# Patient Record
Sex: Female | Born: 1960 | Race: White | Hispanic: No | Smoking: Former smoker
Health system: Southern US, Community
[De-identification: ages and names within clinical notes are randomized; demographics above are authoritative.]

## PROBLEM LIST (undated history)

## (undated) DIAGNOSIS — F319 Bipolar disorder, unspecified: Secondary | ICD-10-CM

## (undated) DIAGNOSIS — L409 Psoriasis, unspecified: Secondary | ICD-10-CM

## (undated) DIAGNOSIS — T7840XA Allergy, unspecified, initial encounter: Secondary | ICD-10-CM

## (undated) DIAGNOSIS — F419 Anxiety disorder, unspecified: Secondary | ICD-10-CM

## (undated) DIAGNOSIS — E782 Mixed hyperlipidemia: Secondary | ICD-10-CM

## (undated) DIAGNOSIS — E119 Type 2 diabetes mellitus without complications: Secondary | ICD-10-CM

## (undated) DIAGNOSIS — C50919 Malignant neoplasm of unspecified site of unspecified female breast: Secondary | ICD-10-CM

## (undated) DIAGNOSIS — Z8679 Personal history of other diseases of the circulatory system: Secondary | ICD-10-CM

## (undated) DIAGNOSIS — I10 Essential (primary) hypertension: Secondary | ICD-10-CM

## (undated) DIAGNOSIS — Z9889 Other specified postprocedural states: Secondary | ICD-10-CM

## (undated) DIAGNOSIS — Z8669 Personal history of other diseases of the nervous system and sense organs: Secondary | ICD-10-CM

## (undated) DIAGNOSIS — F329 Major depressive disorder, single episode, unspecified: Secondary | ICD-10-CM

## (undated) DIAGNOSIS — K219 Gastro-esophageal reflux disease without esophagitis: Secondary | ICD-10-CM

## (undated) DIAGNOSIS — F32A Depression, unspecified: Secondary | ICD-10-CM

## (undated) HISTORY — DX: Bipolar disorder, unspecified: F31.9

## (undated) HISTORY — DX: Malignant neoplasm of unspecified site of unspecified female breast: C50.919

## (undated) HISTORY — PX: BREAST SURGERY: SHX581

## (undated) HISTORY — DX: Mixed hyperlipidemia: E78.2

## (undated) HISTORY — DX: Essential (primary) hypertension: I10

## (undated) HISTORY — DX: Type 2 diabetes mellitus without complications: E11.9

---

## 1990-09-23 HISTORY — PX: LAPAROSCOPY: SHX197

## 2010-09-23 DIAGNOSIS — Z9889 Other specified postprocedural states: Secondary | ICD-10-CM

## 2010-09-23 HISTORY — DX: Other specified postprocedural states: Z98.890

## 2013-05-18 ENCOUNTER — Telehealth: Payer: Self-pay | Admitting: *Deleted

## 2013-05-18 NOTE — Telephone Encounter (Signed)
Confirmed 06/01/13 appt w/ pt.  Mailed before appt letter & packet to pt.  Took paperwork to Med Rec for chart.

## 2013-06-01 ENCOUNTER — Encounter: Payer: Self-pay | Admitting: Oncology

## 2013-06-01 ENCOUNTER — Ambulatory Visit (HOSPITAL_BASED_OUTPATIENT_CLINIC_OR_DEPARTMENT_OTHER): Payer: Medicare Other | Admitting: Oncology

## 2013-06-01 ENCOUNTER — Other Ambulatory Visit: Payer: Medicare Other | Admitting: Lab

## 2013-06-01 ENCOUNTER — Ambulatory Visit: Payer: Medicare Other

## 2013-06-01 VITALS — BP 141/81 | HR 77 | Temp 98.3°F | Resp 18 | Ht 65.0 in | Wt 198.4 lb

## 2013-06-01 DIAGNOSIS — Z170 Estrogen receptor positive status [ER+]: Secondary | ICD-10-CM

## 2013-06-01 DIAGNOSIS — C50419 Malignant neoplasm of upper-outer quadrant of unspecified female breast: Secondary | ICD-10-CM

## 2013-06-01 DIAGNOSIS — C50412 Malignant neoplasm of upper-outer quadrant of left female breast: Secondary | ICD-10-CM

## 2013-06-01 NOTE — Progress Notes (Signed)
Patient ID: Claire Mcbride, female   DOB: 1961-08-19, 52 y.o.   MRN: [id number] ID: Claire Mcbride OB: 1961/03/15  MR#: [id number]  NWG#:[id number]  PCP: No PCP Per Patient GYN:   SU:  OTHER MD: Santiago Bumpers. Fintel, Caren Hollice Gong NP, Maryella Shivers Williams   HISTORY OF PRESENT ILLNESS: Aarianna had routine screening mammography 06/09/2012 at [facility] in [place] showing a nodular density in the left breast associated with intermediate calcifications. Additional views 07/08/2012 confirmed a persistent spiculated density associated with pleomorphic calcifications. On 08/12/2012 the patient underwent ultrasound-guided biopsy of the left breast mass which measured approximately 8 mm at the 1145 position of the left breast as well as a prominent level I lymph node which showed diffuse cortical thickening. 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  On 08/26/2012 the patient underwent bilateral breast MRI confirming an irregular enhancing lesion in the upper inner left breast measuring 3.6 cm in maximal dimensions. The biopsied level I node previously noted measured 2.4 cm. There were other adjacent irregular nodes which were also suspicious no other significant findings were noted. The patient was treated neoadjuvantly with tamoxifen started January 2014.  On 03/17/2013 the patient proceeded to right simple mastectomy and axillary lymph node sampling and left modified radical mastectomy under Dr. Mayford Knife. The final pathology from this procedure ([id number]) showed, in the right breast, no evidence of malignancy and 5 lymph nodes clear.  In the left breast, there was a 2.3 cm invasive ductal carcinoma, grade 3,  involving 14 of 16 axillary lymph nodes sampled. 2 of these were micrometastatic metastases. There was no definite extracapsular extension. The pathologic stage accordingly was pT2 pN3 or stage IIIC.   The patient met with Dr. Waldemar Dickens 03/22/2013 to discuss systemic therapy, and his suggestion was that she continue on tamoxifen. The patient is here today for a second opinion.  INTERVAL HISTORY: Jonny Ruiz was seen in the breast clinic 06/01/2013 and accompanied by her mother.  REVIEW OF SYSTEMS: The patient has a diffusely positive review of systems complaints at of problems with sleep, fatigue, left arm pain (but not swelling) and muscle aches, which are intermittent. She has blurred vision at times. She has some runny nose and other seasonal sinus symptoms. Sometimes she has pain in her chest from her surgery, but not angina or palpitations. She can be short of breath at times, has a dry cough sometimes, has heartburn, has some urinary incontinence which is intermittent, has a history of psoriasis, has some occasional headaches, which are mild, and has a history of anxiety, depression, phobia as and hallucinations. She has problems with diabetes and hot flashes. Otherwise a detailed review of systems today was negative.   PAST MEDICAL HISTORY: No past medical history on file. The patient tells me she has undergone abdominal or laparoscopy to remove some scar tissue in has a right ovarian chronic pain possibly associated with that. She had an overdose of medication 2006. She had a left breast cyst removed in 2003. She tells me that she has bipolar disorder and schizoaffective disorder. She is followed [facility] the [facility] by Elayne Guerin, a nurse practitioner in [place] [phone]).  PAST SURGICAL HISTORY: No past surgical history on file. Status post bilateral  mastectomies with left axillary lymph node dissection  FAMILY HISTORY No family history on  file. The patient's parents are living, in their mid 101s. The patient has one brother and one sister. The patient's mother was diagnosed with in situ breast cancer at the age of 48. There are some cousins with a diagnosis of breast and other cancers, but no other first degree relative with cancer and no history of ovarian cancer in the family. The patient was tested for the BRCA gene and was found to be negative  GYNECOLOGIC HISTORY:   menarche age 41, first live birth age 52, the patient is GX P1. She stopped having periods in 2008.  SOCIAL HISTORY:  The patient is disabled secondary to her psychiatric history. She is divorced. Her mother is with her during this visit. The patient's daughter Alphonzo Dublin lives in [place] of [place] where she is a housewife. The patient has 2 grandchildren. She attends a [employer], research (medical) of [employer].    ADVANCED DIRECTIVES: The patient stated that she completed advanced directives at the time of her mastectomy. With further questioning, what she explained today is that she does not desire futile care or death prolonging care. She would agree to life support if it was instituted with the expectation that she would survive at with an adequate quality of life. The patient's mother was present during this discussion, which took place 06/01/2013   HEALTH MAINTENANCE: History  Substance Use Topics  . Smoking status: Not on file  . Smokeless tobacco: Not on file  . Alcohol Use: Not on file   she smokes approximately 1/2 packs of cigarettes daily, but is planning to switch to an electronic non-nicotine cigarettes. The patient was strongly advised to quit smoking altogether and was offered participation in local smoking cessation programs.   Colonoscopy:-  PAP: 2012  Bone density:  Lipid panel:  Allergies  Allergen Reactions  . Bee Venom   . Diclofenac   . Lidocaine   . Lisinopril   . Losartan   . Other     IV electrolyte solution  . Penicillins   . Protonix  [Pantoprazole Sodium]   . Relpax [Eletriptan]   . Zantac [Ranitidine Hcl]     Current Outpatient Prescriptions  Medication Sig Dispense Refill  . atenolol (TENORMIN) 50 MG tablet Take 50 mg by mouth daily.      Marland Kitchen atorvastatin (LIPITOR) 10 MG tablet Take 10 mg by mouth daily.      Marland Kitchen buPROPion (WELLBUTRIN XL) 300 MG 24 hr tablet Take 300 mg by mouth daily.      . Cholecalciferol (VITAMIN D-3 PO) Take 50,000 Int'l Units by mouth every 14 (fourteen) days.      . clonazePAM (KLONOPIN) 1 MG tablet Take 3 mg by mouth 2 (two) times daily.      Marland Kitchen glimepiride (AMARYL) 2 MG tablet Take 2 mg by mouth daily before breakfast.      . lamoTRIgine (LAMICTAL) 200 MG tablet Take 200 mg by mouth 2 (two) times daily.      Marland Kitchen omeprazole (PRILOSEC) 40 MG capsule Take 40 mg by mouth daily.      . tamoxifen (NOLVADEX) 20 MG tablet Take 20 mg by mouth daily.      Marland Kitchen EPINEPHrine (EPIPEN) 0.3 mg/0.3 mL SOAJ injection Inject into the muscle as needed.       No current facility-administered medications for this visit.    OBJECTIVE: Middle-aged white woman in no acute distress Filed Vitals:   06/01/13  1538  BP: 141/81  Pulse: 77  Temp: 98.3 F (36.8 C)  Resp: 18     Body mass index is 33.02 kg/(m^2).    ECOG FS:1 - Symptomatic but completely ambulatory  Sclerae unicteric, EOMs intact, pupils equal round and reactive to light Oropharynx clear No cervical or supraclavicular adenopathy Lungs no rales or rhonchi Heart regular rate and rhythm Abd obese, benign MSK no focal spinal tenderness, no peripheral edema and specifically no left upper extremity lymphedema Neuro: non-focal, well-oriented (place, date and person), appropriate affect: Despite the psychiatric history, the patient was able to give me a very accurate medical history and to express her wishes regarding treatment unequivocally. She expressed a full understanding of the information that she received today. Breasts: Status post bilateral mastectomies.  The incisions have healed very nicely and the cosmetic result is excellent in the absence of reconstruction. The left axilla is benign.   LAB RESULTS:  CMP  No results found for this basename: na, k, cl, co2, glucose, bun, creatinine, calcium, prot, albumin, ast, alt, alkphos, bilitot, gfrnonaa, gfraa    I No results found for this basename: SPEP, UPEP,  kappa and lambda light chains    No results found for this basename: WBC, NEUTROABS, HGB, HCT, MCV, PLT      Chemistry   No results found for this basename: NA, K, CL, CO2, BUN, CREATININE, GLU   No results found for this basename: CALCIUM, ALKPHOS, AST, ALT, BILITOT       No results found for this basename: LABCA2    No components found with this basename: LABCA125    No results found for this basename: INR,  in the last 168 hours  Urinalysis No results found for this basename: colorurine, appearanceur, labspec, phurine, glucoseu, hgbur, bilirubinur, ketonesur, proteinur, urobilinogen, nitrite, leukocytesur    STUDIES: Outside reports were reviewed. The patient had a chest x-ray 12/28/2012 which showed a mild patchy opacity in the left midlung field. A myocardial perform and SPECT scan 03/05/2013 showed no inducible ischemia. Ejection fraction was 62%. There was normal wall motion. Dr.Fintel's note from 11/04/2012 states that "she had a normal PET scan and does has no clear evidence of stage IV disease".   ASSESSMENT: 52 y.o. BRCA negative [place], [place] woman status post left breast biopsy 08/20/2012 for a clinical T2 N1, stage IIB invasive ductal carcinoma, grade 3, estrogen receptor positive at 10%, progesterone receptor positive at 15%, with no HER-2 amplification  (1) treated neoadjuvantly with tamoxifen started January 2014  (2) status post left modified radical mastectomy 03/17/2013 for a pT2 pN3, stage IIC invasive ductal carcinoma, grade 3 [concurrent right simple mastectomy and axillary lymph node sampling on the  right was benign]  (3) continues on tamoxifen postmastectomy  PLAN: It is hard to say whether the patient had a response to tamoxifen but the original mass by MRI measured 3.6 cm in December 2013, and 2.3 cm in the mastectomy specimen in June of 2014. We do not have an intervening breast MRI to know whether the adenopathy, which was noted in the December 2013 MRI, had changed. Given the fact that at least one of the lymph nodes in the left axilla measured 6 cm at the time of mastectomy, it would probably be prudent to change the patient from tamoxifen to an aromatase inhibitor as far as her anti-estrogen therapy is concerned.  The main issue however is chemotherapy. This is of course entirely standard in someone her age with stage III disease. We  went through the adjuvant! Program and this would quote her a 77% risk of dying with local treatment only (surgery plus radiation), dropping to about 63% if optimal antiestrogen therapy is utilized, and dropping further to 50% if she takes adequate chemotherapy. In short her risk of not dying within 10 years from this cancer would rise from one out of 4 with local treatment to one out of 3 with local treatment plus anti estrogens, to one out of 2 if she takes chemotherapy in addition. Accordingly I recommended she undergo chemotherapy.   Specifically I suggested she consider "TAC" (cyclophosphamide, docetaxel and doxorubicin) given at standard doses every 3 weeks for 6 cycles. We discussed the possible toxicities, side effects and complications an Allis information was given to the patient in writing. She understands that she will benefit from a port and this is being arranged. She understands also that she will need postmastectomy radiation after the completion of chemotherapy.  Bennye would like to receive her chemotherapy in the [facility] in [place], which is considerably closer to her home and I have arranged for her to meet with Dr. Tawnya Crook  September 26 at 10 AM to discuss this further. The patient will also be able to receive her radiation treatments there under the care of Dr. Thersa Salt.   Colbie has a good understanding of this plan. She appears very motivated to proceed. She knows we will be glad to see her here at any time as the need arises, and I have made her a courtesy appointment for mid February 2015 which is around the time when I expect her chemotherapy will be coming to a close.    Lowella Dell, MD   06/01/2013 5:50 PM

## 2013-06-01 NOTE — Progress Notes (Signed)
Checked in new pt with no financial concerns. °

## 2013-06-02 ENCOUNTER — Telehealth: Payer: Self-pay | Admitting: Oncology

## 2013-06-02 ENCOUNTER — Encounter: Payer: Self-pay | Admitting: *Deleted

## 2013-06-02 ENCOUNTER — Other Ambulatory Visit: Payer: Self-pay | Admitting: *Deleted

## 2013-06-02 DIAGNOSIS — C50412 Malignant neoplasm of upper-outer quadrant of left female breast: Secondary | ICD-10-CM

## 2013-06-02 NOTE — Progress Notes (Signed)
Notes faxed to Dr Vilinda Flake, Surgeon, [phone], [phone]. Dr Olin Pia [phone], fax-[phone] Dr Chauncy Passy in [place].  Port placement and Echo to be scheduled.

## 2013-06-02 NOTE — Progress Notes (Signed)
Faxed records to [facility] per Dr. Darnelle Catalan.

## 2013-06-03 ENCOUNTER — Other Ambulatory Visit: Payer: Self-pay | Admitting: Oncology

## 2013-06-03 DIAGNOSIS — C50919 Malignant neoplasm of unspecified site of unspecified female breast: Secondary | ICD-10-CM

## 2013-06-07 ENCOUNTER — Other Ambulatory Visit: Payer: Self-pay | Admitting: Radiology

## 2013-06-09 ENCOUNTER — Encounter: Payer: Self-pay | Admitting: Oncology

## 2013-06-10 ENCOUNTER — Ambulatory Visit (HOSPITAL_COMMUNITY)
Admission: RE | Admit: 2013-06-10 | Discharge: 2013-06-10 | Disposition: A | Discharge status: Home / Self Care | Payer: Medicare Other | Source: Ambulatory Visit | Attending: Oncology | Admitting: Oncology

## 2013-06-10 ENCOUNTER — Other Ambulatory Visit: Payer: Self-pay | Admitting: Oncology

## 2013-06-10 ENCOUNTER — Other Ambulatory Visit: Payer: Self-pay | Admitting: *Deleted

## 2013-06-10 ENCOUNTER — Encounter (HOSPITAL_COMMUNITY): Payer: Self-pay

## 2013-06-10 ENCOUNTER — Telehealth: Payer: Self-pay | Admitting: *Deleted

## 2013-06-10 DIAGNOSIS — I10 Essential (primary) hypertension: Secondary | ICD-10-CM | POA: Insufficient documentation

## 2013-06-10 DIAGNOSIS — C50412 Malignant neoplasm of upper-outer quadrant of left female breast: Secondary | ICD-10-CM

## 2013-06-10 DIAGNOSIS — E119 Type 2 diabetes mellitus without complications: Secondary | ICD-10-CM | POA: Insufficient documentation

## 2013-06-10 DIAGNOSIS — C50919 Malignant neoplasm of unspecified site of unspecified female breast: Secondary | ICD-10-CM

## 2013-06-10 DIAGNOSIS — C801 Malignant (primary) neoplasm, unspecified: Secondary | ICD-10-CM | POA: Insufficient documentation

## 2013-06-10 DIAGNOSIS — F319 Bipolar disorder, unspecified: Secondary | ICD-10-CM | POA: Insufficient documentation

## 2013-06-10 DIAGNOSIS — E782 Mixed hyperlipidemia: Secondary | ICD-10-CM | POA: Insufficient documentation

## 2013-06-10 DIAGNOSIS — I517 Cardiomegaly: Secondary | ICD-10-CM

## 2013-06-10 DIAGNOSIS — C50419 Malignant neoplasm of upper-outer quadrant of unspecified female breast: Secondary | ICD-10-CM | POA: Insufficient documentation

## 2013-06-10 DIAGNOSIS — Z87891 Personal history of nicotine dependence: Secondary | ICD-10-CM | POA: Insufficient documentation

## 2013-06-10 DIAGNOSIS — L409 Psoriasis, unspecified: Secondary | ICD-10-CM | POA: Insufficient documentation

## 2013-06-10 DIAGNOSIS — Z01818 Encounter for other preprocedural examination: Secondary | ICD-10-CM | POA: Insufficient documentation

## 2013-06-10 HISTORY — DX: Psoriasis, unspecified: L40.9

## 2013-06-10 LAB — CBC
HCT: 38.8 % (ref 36.0–46.0)
Hemoglobin: 12.6 g/dL (ref 12.0–15.0)
MCH: 29.3 pg (ref 26.0–34.0)
MCHC: 32.5 g/dL (ref 30.0–36.0)

## 2013-06-10 LAB — APTT: aPTT: 30 seconds (ref 24–37)

## 2013-06-10 LAB — PROTIME-INR: INR: 1.05 (ref 0.00–1.49)

## 2013-06-10 MED ORDER — MIDAZOLAM HCL 2 MG/2ML IJ SOLN
INTRAMUSCULAR | Status: AC | PRN
  Administered 2013-06-10 (×2): 1 mg via INTRAVENOUS
  Administered 2013-06-10: 2 mg via INTRAVENOUS

## 2013-06-10 MED ORDER — FENTANYL CITRATE 0.05 MG/ML IJ SOLN
INTRAMUSCULAR | Status: AC | PRN
  Administered 2013-06-10 (×2): 50 ug via INTRAVENOUS
  Administered 2013-06-10: 100 ug via INTRAVENOUS

## 2013-06-10 MED ORDER — VANCOMYCIN HCL IN DEXTROSE 1-5 GM/200ML-% IV SOLN
1000.0000 mg | INTRAVENOUS | Status: AC
  Administered 2013-06-10: 1000 mg via INTRAVENOUS
  Filled 2013-06-10: qty 200

## 2013-06-10 MED ORDER — SODIUM CHLORIDE 0.9 % IV SOLN
INTRAVENOUS | Status: DC
  Administered 2013-06-10: 13:00:00 via INTRAVENOUS

## 2013-06-10 MED ORDER — MIDAZOLAM HCL 2 MG/2ML IJ SOLN
INTRAMUSCULAR | Status: AC
  Filled 2013-06-10: qty 6

## 2013-06-10 MED ORDER — FENTANYL CITRATE 0.05 MG/ML IJ SOLN
INTRAMUSCULAR | Status: AC
  Filled 2013-06-10: qty 4

## 2013-06-10 MED ORDER — CEFAZOLIN SODIUM-DEXTROSE 2-3 GM-% IV SOLR
INTRAVENOUS | Status: AC
  Filled 2013-06-10: qty 50

## 2013-06-10 MED ORDER — TAMOXIFEN CITRATE 20 MG PO TABS: 20.0000 mg | ORAL_TABLET | Freq: Every day | ORAL | Status: DC

## 2013-06-10 NOTE — Progress Notes (Signed)
Echocardiogram 2D Echocardiogram has been performed.  Claire Mcbride 06/10/2013, 12:30 PM

## 2013-06-10 NOTE — H&P (Signed)
Agree 

## 2013-06-10 NOTE — H&P (Signed)
Claire Mcbride is an 52 y.o. female.   Chief Complaint: Dx with left breast ca 06/2012 Treatment with Tamoxifen started 09/2012- ongoing New biopsy 02/2013 +Ca left breast with extensive lymph node involvement; neg on Rt; Bilateral mastectomies at that time Scheduled now for [facility] a cath placement for chemo per Dr Darnelle Catalan  HPI: Breast ca; B mastectomies 02/2013; Bipolar; DM; HTN; HLD  Past Medical History  Diagnosis Date  . Cancer     Breast ca  . Bipolar 1 disorder   . Psoriasis   . Diabetes mellitus without complication   . Hypertension   . High cholesterol     Past Surgical History  Procedure Laterality Date  . Breast surgery Bilateral     History reviewed. No pertinent family history. Social History:  reports that she has never smoked. She does not have any smokeless tobacco history on file. Her alcohol and drug histories are not on file.  Allergies:  Allergies  Allergen Reactions  . Bee Venom   . Diclofenac   . Lidocaine   . Lisinopril   . Losartan   . Other     IV electrolyte solution  . Penicillins   . Protonix [Pantoprazole Sodium]   . Relpax [Eletriptan]   . Zantac [Ranitidine Hcl]      (Not in a hospital admission)  No results found for this or any previous visit (from the past 48 hour(s)). No results found.  Review of Systems  Constitutional: Negative for fever and weight loss.  Respiratory: Negative for cough.   Cardiovascular: Positive for chest pain.       From surgery  Gastrointestinal: Positive for nausea. Negative for vomiting and abdominal pain.  Neurological: Positive for dizziness, weakness and headaches.    There were no vitals taken for this visit. Physical Exam  Constitutional: She is oriented to person, place, and time. She appears well-nourished.  Cardiovascular: Normal rate, regular rhythm and normal heart sounds.   No murmur heard. Respiratory: Effort normal and breath sounds normal. She has no wheezes.  B mastectomy  GI: Soft.  Bowel sounds are normal. There is no tenderness.  Musculoskeletal: Normal range of motion. She exhibits no tenderness.  Neurological: She is alert and oriented to person, place, and time.  Skin: Skin is warm and dry. No erythema.  Psychiatric: She has a normal mood and affect. Her behavior is normal. Judgment and thought content normal.     Assessment/Plan Breast Ca Need for chemo per Dr Darnelle Catalan Scheduled for [facility] placement now Pt aware of procedure benefits and risks and agreeable to proceed Consent signed and in chart  Claire Mcbride A 06/10/2013, 1:12 PM

## 2013-06-10 NOTE — Procedures (Signed)
Procedure:  Porta-cath placement Access:  Right IJ vein Findings:  SL PAC placed.  Cath tip at cavoatrial junction.  OK to use.  No PTX.

## 2013-06-10 NOTE — Telephone Encounter (Signed)
Patient called for Tamoxifen Rx that was ordered this morning

## 2013-06-18 ENCOUNTER — Other Ambulatory Visit (HOSPITAL_COMMUNITY): Payer: Self-pay | Admitting: Internal Medicine

## 2013-06-18 DIAGNOSIS — C50912 Malignant neoplasm of unspecified site of left female breast: Secondary | ICD-10-CM

## 2013-06-18 DIAGNOSIS — Z9010 Acquired absence of unspecified breast and nipple: Secondary | ICD-10-CM

## 2013-06-18 DIAGNOSIS — R55 Syncope and collapse: Secondary | ICD-10-CM

## 2013-06-18 DIAGNOSIS — C50219 Malignant neoplasm of upper-inner quadrant of unspecified female breast: Secondary | ICD-10-CM

## 2013-06-22 ENCOUNTER — Other Ambulatory Visit: Payer: Self-pay | Admitting: *Deleted

## 2013-06-22 ENCOUNTER — Encounter (HOSPITAL_COMMUNITY)
Admission: RE | Admit: 2013-06-22 | Discharge: 2013-06-22 | Disposition: A | Discharge status: Home / Self Care | Payer: Medicare Other | Source: Ambulatory Visit | Attending: Internal Medicine | Admitting: Internal Medicine

## 2013-06-22 DIAGNOSIS — C50412 Malignant neoplasm of upper-outer quadrant of left female breast: Secondary | ICD-10-CM

## 2013-06-22 DIAGNOSIS — C50912 Malignant neoplasm of unspecified site of left female breast: Secondary | ICD-10-CM

## 2013-06-22 DIAGNOSIS — C50919 Malignant neoplasm of unspecified site of unspecified female breast: Secondary | ICD-10-CM | POA: Insufficient documentation

## 2013-06-22 MED ORDER — FLUDEOXYGLUCOSE F - 18 (FDG) INJECTION
18.0000 | Freq: Once | INTRAVENOUS | Status: AC | PRN
  Administered 2013-06-22: 18 via INTRAVENOUS

## 2013-06-24 ENCOUNTER — Other Ambulatory Visit: Payer: Self-pay | Admitting: Oncology

## 2013-06-24 DIAGNOSIS — R55 Syncope and collapse: Secondary | ICD-10-CM

## 2013-06-24 DIAGNOSIS — C50919 Malignant neoplasm of unspecified site of unspecified female breast: Secondary | ICD-10-CM

## 2013-06-24 DIAGNOSIS — Z170 Estrogen receptor positive status [ER+]: Secondary | ICD-10-CM

## 2013-06-24 DIAGNOSIS — Z9010 Acquired absence of unspecified breast and nipple: Secondary | ICD-10-CM

## 2013-06-24 NOTE — Progress Notes (Signed)
I was called today by the very competent sounding locum tendons at [facility] did tell me that she has restaged Ms. Rando and there is significant regional adenopathy by PET scan. I have reviewed the films, and there is no distant disease.  My suggestion to her was that we biopsy one of these nodes assuming that that can be done without too much morbidity, and then proceed to chemotherapy. We had previously discussed TAC about any equivalent would be fine. She will need radiation after the chemotherapy and that we'll need to include all the areas involved in the current pads if at all possible.

## 2013-06-29 ENCOUNTER — Ambulatory Visit (INDEPENDENT_AMBULATORY_CARE_PROVIDER_SITE_OTHER): Payer: Medicare Other | Admitting: Cardiology

## 2013-06-29 ENCOUNTER — Encounter: Payer: Self-pay | Admitting: Cardiology

## 2013-06-29 VITALS — BP 151/81 | HR 88 | Ht 65.5 in | Wt 204.8 lb

## 2013-06-29 DIAGNOSIS — C50419 Malignant neoplasm of upper-outer quadrant of unspecified female breast: Secondary | ICD-10-CM

## 2013-06-29 DIAGNOSIS — R002 Palpitations: Secondary | ICD-10-CM | POA: Insufficient documentation

## 2013-06-29 DIAGNOSIS — E782 Mixed hyperlipidemia: Secondary | ICD-10-CM

## 2013-06-29 DIAGNOSIS — C50412 Malignant neoplasm of upper-outer quadrant of left female breast: Secondary | ICD-10-CM

## 2013-06-29 NOTE — Assessment & Plan Note (Signed)
On Lipitor, followed by Dr. Clifton Custard.

## 2013-06-29 NOTE — Progress Notes (Signed)
Clinical Summary Claire Mcbride is a 52 y.o.female referred for cardiology consultation by Dr. Leanna Sato at the [facility] cancer center. She also sees Dr. Darnelle Catalan in [place]. She has a history of breast cancer, detailed in Dr. Darrall Dears note from September. She has been treated with tamoxifen, also status post left modified radical mastectomy with concurrent right simple mastectomy in June. Records indicate that she is being considered for chemotherapy including cyclophosphamide, docetaxel, and doxorubicin.  Recent pre-chemotherapy echocardiogram in September revealed mild LVH with LVEF 55-60%, normal diastolic parameters, mild left atrial enlargement, no major valvular abnormalities. She also brought in outside records including ECGs that show sinus rhythm with anterior T wave inversions, inferior T wave inversions, new compared to March of this year as well as prior studies, potentially secondary to electrical changes associated with her mastectomies and different chest anatomy. She did have a myocardial perfusion study performed at [facility] in June, the report of which indicates no inducible ischemia, LVEF 52%, mild septal/intraseptal thinning, overall low risk study.  She presents today with her father, specifically concerned about an "arrhythmia" she states she has had intermittently for years, but has been worse with recent saline infusions through her power port. She is concerned that she might have a "heart attack" while she is having an infusion. As best I can ascertain, she has no clearly documented history of cardiac arrhythmia, all of her reviewable ECGs today shows sinus rhythm. She does state that she feels better when on atenolol and reports compliance with her medication.  She tells me that she is actually concerned she may have had "allergic reactions" to the saline infusion. She states that she has researched this on the Internet and is concerned that she may have had  some type of allergy to the "preservatives." She states she has even contacted the company that made the saline. I am not aware that she has discussed any of this with her oncologists.  Records show that she did have a Port-A-Cath placed by Dr. Fredia Sorrow.in September. His note indicates that the tip of the catheter was located at the cavoatrial junction and aspirated normally.  Patient states that she has never worn a cardiac monitor to document any specific dysrhythmias.   Allergies  Allergen Reactions  . Bee Venom   . Diclofenac   . Lidocaine   . Lisinopril   . Losartan   . Other     IV electrolyte solution with perservates  . Penicillins   . Protonix [Pantoprazole Sodium]   . Relpax [Eletriptan]   . Zantac [Ranitidine Hcl]     Current Outpatient Prescriptions  Medication Sig Dispense Refill  . atenolol (TENORMIN) 50 MG tablet Take 50 mg by mouth daily.      Marland Kitchen atorvastatin (LIPITOR) 10 MG tablet Take 10 mg by mouth daily.      Marland Kitchen buPROPion (WELLBUTRIN XL) 300 MG 24 hr tablet Take 300 mg by mouth daily.      . Cholecalciferol (VITAMIN D-3 PO) Take 50,000 Int'l Units by mouth every 14 (fourteen) days.      . clonazePAM (KLONOPIN) 1 MG tablet Take 1 mg by mouth 2 (two) times daily. 1 tablet in AM and 2 tablets at PM      . EPINEPHrine (EPIPEN) 0.3 mg/0.3 mL SOAJ injection Inject into the muscle as needed.      Marland Kitchen glimepiride (AMARYL) 2 MG tablet Take 4 mg by mouth daily before breakfast.       . lamoTRIgine (  LAMICTAL) 100 MG tablet Take 100 mg by mouth 2 (two) times daily.      Marland Kitchen omeprazole (PRILOSEC) 40 MG capsule Take 40 mg by mouth daily.      . tamoxifen (NOLVADEX) 20 MG tablet Take 1 tablet (20 mg total) by mouth daily.  30 tablet  0   No current facility-administered medications for this visit.    Past Medical History  Diagnosis Date  . Breast cancer   . Bipolar disorder   . Psoriasis   . Type 2 diabetes mellitus   . Essential hypertension, benign   . Mixed  hyperlipidemia     Past Surgical History  Procedure Laterality Date  . Breast surgery Bilateral     Family History  Problem Relation Age of Onset  . Breast cancer Mother     Social History Claire Mcbride reports that she quit smoking about 2 weeks ago. Her smoking use included Cigarettes. She has a 15 pack-year smoking history. She does not have any smokeless tobacco history on file. Claire Mcbride reports that she does not drink alcohol.  Review of Systems Reports occasional palpitations when she is under stress. She has had no syncope or palpitations. No regular exertional chest pain. No orthopnea or PND. No reported fevers or chills. Otherwise negative.  Physical Examination Filed Vitals:   06/29/13 0920  BP: 151/81  Pulse: 88   Filed Weights   06/29/13 0920  Weight: 204 lb 12.8 oz (92.897 kg)   Overweight woman, appears comfortable at rest. HEENT: Conjunctiva and lids normal, oropharynx clear. Neck: Supple, no elevated JVP or carotid bruits, no thyromegaly. Lungs: Clear to auscultation, nonlabored breathing at rest. Cardiac: Regular rate and rhythm, no S3 or significant systolic murmur, no pericardial rub. Abdomen: Soft, nontender, bowel sounds present, no guarding or rebound. Extremities: No pitting edema, distal pulses 2+. Skin: Warm and dry. Musculoskeletal: No kyphosis. Neuropsychiatric: Alert and oriented x3, affect grossly appropriate.   Problem List and Plan   Palpitations Etiology is not clear. She may simply be experiencing symptomatic ectopy. ECGs reviewed, all showing sinus rhythm. Recent echocardiogram shows normal LVEF without wall motion abnormalities, no major valvular abnormalities. Also had a low risk myocardial perfusion study in [place] back in June. As noted above, she has concerns about the saline infusions she has received through her power port in terms of potential adverse reactions. I asked her to discuss this with her oncologists, as I am not  entirely certain what she has received. It would appear that her catheter position was adequate the time it was placed by Dr. Fredia Sorrow based on record review. I did mention to her that sometimes people can have some increased ectopy when the catheter is positioned near the right atrium. I also recommended that she discuss this with her oncologists, as she could also have a followup chest x-ray to determine catheter position. I recommended that she wear a cardiac monitor to see if we can objectively determine whether she is experiencing any true arrhythmias or not. In the meanwhile no change to current beta blocker.  Breast cancer of upper-outer quadrant of left female breast Managed by Dr. Darnelle Catalan and Dr. Leanna Sato. Potential chemotherapeutic drugs discussed above, including doxorubicin. Her regimen does have an increased potential for cardiotoxicity, and she will clearly need to have followup assessment of LV function on a regular basis, a typical protocol per oncology. Baseline LVEF is normal.  Mixed hyperlipidemia On Lipitor, followed by Dr. Clifton Custard.    Jonelle Sidle, M.D., F.A.C.C.

## 2013-06-29 NOTE — Assessment & Plan Note (Signed)
Managed by Dr. Darnelle Catalan and Dr. Leanna Sato. Potential chemotherapeutic drugs discussed above, including doxorubicin. Her regimen does have an increased potential for cardiotoxicity, and she will clearly need to have followup assessment of LV function on a regular basis, a typical protocol per oncology. Baseline LVEF is normal.

## 2013-06-29 NOTE — Patient Instructions (Addendum)
Your physician recommends that you schedule a follow-up appointment in: 1 month. Your physician recommends that you continue on your current medications as directed. Please refer to the Current Medication list given to you today. Your physician has recommended that you wear an event monitor for 21 days. Event monitors are medical devices that record the heart's electrical activity. Doctors most often [place] these monitors to diagnose arrhythmias. Arrhythmias are problems with the speed or rhythm of the heartbeat. The monitor is a small, portable device. You can wear one while you do your normal daily activities. This is usually used to diagnose what is causing palpitations/syncope (passing out). [employer] will contact you directly about this monitor.

## 2013-06-29 NOTE — Assessment & Plan Note (Signed)
Etiology is not clear. She may simply be experiencing symptomatic ectopy. ECGs reviewed, all showing sinus rhythm. Recent echocardiogram shows normal LVEF without wall motion abnormalities, no major valvular abnormalities. Also had a low risk myocardial perfusion study in [place] back in June. As noted above, she has concerns about the saline infusions she has received through her power port in terms of potential adverse reactions. I asked her to discuss this with her oncologists, as I am not entirely certain what she has received. It would appear that her catheter position was adequate the time it was placed by Dr. Fredia Sorrow based on record review. I did mention to her that sometimes people can have some increased ectopy when the catheter is positioned near the right atrium. I also recommended that she discuss this with her oncologists, as she could also have a followup chest x-ray to determine catheter position. I recommended that she wear a cardiac monitor to see if we can objectively determine whether she is experiencing any true arrhythmias or not. In the meanwhile no change to current beta blocker.

## 2013-06-30 DIAGNOSIS — R002 Palpitations: Secondary | ICD-10-CM

## 2013-06-30 DIAGNOSIS — R42 Dizziness and giddiness: Secondary | ICD-10-CM

## 2013-06-30 DIAGNOSIS — C50919 Malignant neoplasm of unspecified site of unspecified female breast: Secondary | ICD-10-CM

## 2013-07-02 DIAGNOSIS — R002 Palpitations: Secondary | ICD-10-CM

## 2013-07-07 DIAGNOSIS — C50919 Malignant neoplasm of unspecified site of unspecified female breast: Secondary | ICD-10-CM

## 2013-07-07 DIAGNOSIS — Z5111 Encounter for antineoplastic chemotherapy: Secondary | ICD-10-CM

## 2013-07-08 ENCOUNTER — Other Ambulatory Visit: Payer: Self-pay | Admitting: *Deleted

## 2013-07-08 ENCOUNTER — Telehealth: Payer: Self-pay | Admitting: Cardiology

## 2013-07-08 DIAGNOSIS — R002 Palpitations: Secondary | ICD-10-CM

## 2013-07-08 DIAGNOSIS — I495 Sick sinus syndrome: Secondary | ICD-10-CM

## 2013-07-08 MED ORDER — ATENOLOL 25 MG PO TABS: 25.0000 mg | ORAL_TABLET | Freq: Every day | ORAL | Status: DC

## 2013-07-08 NOTE — Progress Notes (Signed)
[facility] strips faxed to [employer]. Office. Patient aware of dose decrease of atenolol, continue wearing monitor and aware of appointment with Dr. Ladona Ridgel tomorrow at the [facility]. Office at 9:30 am.

## 2013-07-08 NOTE — Progress Notes (Signed)
I was provided eCardio strips for review this morning on Claire Mcbride. The multitude of these strips show sinus rhythm and artifact despite a variety of symptoms reported. On day 7 of 21 however at 3:52 AM the patient had an episode of severe bradycardia associated with possible junctional (or ventricular escape) rhythm followed by lead loss. Patient was contacted by nursing, reported other symptoms that she had been experiencing recently during the daytime and in the setting of recent chemotherapy. She was asleep at the time that this bradycardic event was recorded. She has had no syncope, no sudden dizziness. Results were conveyed to her. With nocturnal bradycardic episodes, an evaluation for sleep apnea would be appropriate. This information will be forwarded to her primary care provider. On the other hand, given the severity of the bradycardia, atenolol dose will be reduced, and she will be referred for EP consultation. She will continue to wear the monitor, and we will look for episodes of bradycardia or pauses otherwise during the daytime hours. Current strips to be scanned into Epic.  Jonelle Sidle, M.D., F.A.C.C.

## 2013-07-08 NOTE — Telephone Encounter (Signed)
Please call Annice Pih at the [facility] ext # [phone] . Mrs. Pilkington called the cancer center With questions and they need to speak to our office regarding these questions.

## 2013-07-08 NOTE — Telephone Encounter (Signed)
Spoke with Annice Pih and informed her of critical reports received by [facility] and also informed her of plan given to patient today.

## 2013-07-09 ENCOUNTER — Ambulatory Visit (INDEPENDENT_AMBULATORY_CARE_PROVIDER_SITE_OTHER): Payer: Medicare Other | Admitting: Internal Medicine

## 2013-07-09 ENCOUNTER — Encounter: Payer: Self-pay | Admitting: Internal Medicine

## 2013-07-09 VITALS — BP 154/83 | HR 83 | Ht 65.5 in | Wt 198.1 lb

## 2013-07-09 DIAGNOSIS — R002 Palpitations: Secondary | ICD-10-CM

## 2013-07-09 DIAGNOSIS — I498 Other specified cardiac arrhythmias: Secondary | ICD-10-CM

## 2013-07-09 DIAGNOSIS — D702 Other drug-induced agranulocytosis: Secondary | ICD-10-CM

## 2013-07-09 DIAGNOSIS — R001 Bradycardia, unspecified: Secondary | ICD-10-CM

## 2013-07-09 NOTE — Assessment & Plan Note (Signed)
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 I've discussed the benign nature of PVCs with the patient. She will continue low-dose beta blocker therapy.

## 2013-07-09 NOTE — Patient Instructions (Signed)
Your physician recommends that you schedule a follow-up appointment as needed  

## 2013-07-09 NOTE — Assessment & Plan Note (Signed)
The patient does have fairly marked bradycardia. I discussed the benign nature of her nocturnal bradycardia, and have recommended that she reduce her dose of atenolol from 50 mg daily down to 25 mg daily. She currently has no symptomatic bradycardia, and for that reason permanent pacemaker insertion is not warranted. She will undergo watchful waiting. I will see her on an as-needed basis.

## 2013-07-09 NOTE — Progress Notes (Signed)
HPI Mrs. Balboni is referred today by Dr. Diona Browner for evaluation of bradycardia and palpitations. She is a very pleasant 52 year old woman with metastatic breast cancer.  She is referred today that she was found to have nocturnal bradycardia on her cardiac monitor. The patient's main symptoms are palpitations which are infrequent and occur during the daytime. Her cardiac monitor has shown PVCs. She has had no sustained tachycardia. The patient had a 4 second pause at 3 this morning. No other complaints. She is quite anxious about this. She was sleeping when this occurred. She is in the process of undergoing chemotherapy for her metastatic breast cancer.  Allergies  Allergen Reactions  . Bee Venom   . Diclofenac   . Lidocaine   . Lisinopril   . Losartan   . Other     IV electrolyte solution with perservates  . Penicillins   . Protonix [Pantoprazole Sodium]   . Relpax [Eletriptan]   . Zantac [Ranitidine Hcl]      Current Outpatient Prescriptions  Medication Sig Dispense Refill  . atenolol (TENORMIN) 50 MG tablet Take 50 mg by mouth daily.      Marland Kitchen atorvastatin (LIPITOR) 10 MG tablet Take 10 mg by mouth daily.      Marland Kitchen buPROPion (WELLBUTRIN XL) 300 MG 24 hr tablet Take 300 mg by mouth daily.      . Cholecalciferol (VITAMIN D-3 PO) Take 50,000 Int'l Units by mouth every 14 (fourteen) days.      . clonazePAM (KLONOPIN) 1 MG tablet Take 1 mg by mouth 2 (two) times daily. 1 tablet in AM and 2 tablets at PM      . cyclophosphamide (CYTOXAN) 2 G chemo injection Inject into the vein every 21 ( twenty-one) days.      Marland Kitchen DOXOrubicin HCl (ADRIAMYCIN IV) Inject into the vein every 21 ( twenty-one) days.      Marland Kitchen EPINEPHrine (EPIPEN) 0.3 mg/0.3 mL SOAJ injection Inject into the muscle as needed.      Marland Kitchen glimepiride (AMARYL) 2 MG tablet Take 4 mg by mouth daily before breakfast.       . HYDROcodone-acetaminophen (NORCO/VICODIN) 5-325 MG per tablet Take 1 tablet by mouth every 6 (six) hours as needed  for pain.      Marland Kitchen ibuprofen (ADVIL,MOTRIN) 200 MG tablet Take 200 mg by mouth every 6 (six) hours as needed for pain.      Marland Kitchen lamoTRIgine (LAMICTAL) 100 MG tablet Take 100 mg by mouth 2 (two) times daily.      Marland Kitchen omeprazole (PRILOSEC) 40 MG capsule Take 40 mg by mouth daily.      . Oxycodone HCl 10 MG TABS Take 1 mg by mouth every 6 (six) hours as needed.      . tamoxifen (NOLVADEX) 20 MG tablet Take 1 tablet (20 mg total) by mouth daily.  30 tablet  0   No current facility-administered medications for this visit.     Past Medical History  Diagnosis Date  . Breast cancer   . Bipolar disorder   . Psoriasis   . Type 2 diabetes mellitus   . Essential hypertension, benign   . Mixed hyperlipidemia     ROS:   All systems reviewed and negative except as noted in the HPI.   Past Surgical History  Procedure Laterality Date  . Breast surgery Bilateral      Family History  Problem Relation Age of Onset  . Breast cancer Mother  History   Social History  . Marital Status: Divorced    Spouse Name: N/A    Number of Children: N/A  . Years of Education: N/A   Occupational History  . Not on file.   Social History Main Topics  . Smoking status: Former Smoker -- 0.50 packs/day for 30 years    Types: Cigarettes    Quit date: 06/12/2013  . Smokeless tobacco: Not on file  . Alcohol Use: No  . Drug Use: No  . Sexual Activity: Not on file   Other Topics Concern  . Not on file   Social History Narrative  . No narrative on file     BP 154/83  Pulse 83  Ht 5' 5.5" (1.664 m)  Wt 198 lb 1.9 oz (89.867 kg)  BMI 32.46 kg/m2  Physical Exam:  Well appearing 52 year old woman,NAD HEENT: Unremarkable Neck:  No JVD, no thyromegally Back:  No CVA tenderness Lungs:  Clear with no wheezes, rales, or rhonchi. HEART:  Regular rate rhythm, no murmurs, no rubs, no clicks Abd:  soft, positive bowel sounds, no organomegally, no rebound, no guarding Ext:  2 plus pulses, no edema,  no cyanosis, no clubbing Skin:  No rashes no nodules Neuro:  CN II through XII intact, motor grossly intact  Cardiac monitor - normal sinus rhythm with rare PVCs and nocturnal bradycardia   Assess/Plan:

## 2013-07-21 DIAGNOSIS — C50919 Malignant neoplasm of unspecified site of unspecified female breast: Secondary | ICD-10-CM

## 2013-07-21 DIAGNOSIS — D696 Thrombocytopenia, unspecified: Secondary | ICD-10-CM

## 2013-07-21 DIAGNOSIS — Z5111 Encounter for antineoplastic chemotherapy: Secondary | ICD-10-CM

## 2013-07-21 DIAGNOSIS — R51 Headache: Secondary | ICD-10-CM

## 2013-07-23 DIAGNOSIS — D702 Other drug-induced agranulocytosis: Secondary | ICD-10-CM

## 2013-07-27 ENCOUNTER — Other Ambulatory Visit: Payer: Self-pay | Admitting: Oncology

## 2013-07-27 NOTE — Telephone Encounter (Signed)
Confirmed she is seeing Dr. Chauncy Passy in [place]. Faxed refill request to [facility] office.

## 2013-07-28 ENCOUNTER — Telehealth: Payer: Self-pay | Admitting: *Deleted

## 2013-07-28 NOTE — Telephone Encounter (Signed)
Patient states she woke up this morning with chest pain rated 5-6 on a scale of 1-10 (10 being the greatest). Patient states the pain lasted a couple of minutes and it woke her up out of her sleep. No c/o sob, or dizziness. Patient said she's had indigestion for about 3 days also. Patient said she isn't having any pain now. Patient doesn't have nitroglycerin. Nurse advised patient that MD would be informed for further advise.

## 2013-07-28 NOTE — Telephone Encounter (Signed)
Not entirely sure what to make of her symptoms. She could be experiencing chest pain related to tachycardia. I just reviewed extensive cardiac monitoring on the patient. Please refer to my recent comments on the cardiac monitor and make sure that the results have been forwarded to Dr. Ladona Ridgel for his review as well. She has already undergone ischemic testing which was low risk. Recommend continuing beta blocker for now.

## 2013-07-28 NOTE — Telephone Encounter (Signed)
Patient informed and results of monitor forwarded to Dr. Ladona Ridgel for review.

## 2013-07-29 ENCOUNTER — Encounter: Payer: Self-pay | Admitting: Cardiology

## 2013-07-29 ENCOUNTER — Other Ambulatory Visit: Payer: Self-pay

## 2013-07-29 ENCOUNTER — Ambulatory Visit (INDEPENDENT_AMBULATORY_CARE_PROVIDER_SITE_OTHER): Payer: Medicare Other | Admitting: Cardiology

## 2013-07-29 VITALS — BP 151/79 | HR 94 | Ht 65.5 in | Wt 202.8 lb

## 2013-07-29 DIAGNOSIS — I498 Other specified cardiac arrhythmias: Secondary | ICD-10-CM

## 2013-07-29 DIAGNOSIS — R002 Palpitations: Secondary | ICD-10-CM

## 2013-07-29 DIAGNOSIS — R001 Bradycardia, unspecified: Secondary | ICD-10-CM

## 2013-07-29 DIAGNOSIS — I471 Supraventricular tachycardia: Secondary | ICD-10-CM | POA: Insufficient documentation

## 2013-07-29 MED ORDER — ATENOLOL 25 MG PO TABS: 37.5000 mg | ORAL_TABLET | Freq: Every day | ORAL | Status: DC

## 2013-07-29 NOTE — Patient Instructions (Signed)
   Change Atenolol to 25mg  - take 1 1/2 tabs daily - new sent to pharm Continue all other medications.   Follow up in  2 months

## 2013-07-29 NOTE — Assessment & Plan Note (Signed)
Patient's monitor shows sinus tachycardia at times, cannot entirely exclude PSVT or even atrial tachycardia/flutter. For now will increase her atenolol to 37.5 mg daily. I have asked Dr. Ladona Ridgel to review the strips to see if he has any other EP recommendations. With an episode of significant bradycardia also documented, this may make treatment options more challenging.

## 2013-07-29 NOTE — Assessment & Plan Note (Signed)
Episode of nocturnal bradycardia/pause noted. She has had no episodes during daytime hours by monitoring, no syncope. She tells me that she had a sleep study 3 years ago that did not demonstrate frank sleep apnea. I have already had her evaluated by Dr. Ladona Ridgel.

## 2013-07-29 NOTE — Assessment & Plan Note (Signed)
Presumably related to tachycardia and/or PSVT as noted. We will try to cautiously advance her beta blocker, but not back to her prior dose.

## 2013-07-29 NOTE — Progress Notes (Signed)
Clinical Summary Claire Mcbride is a 52 y.o.female following up from consultation done back in October. Please see the previous extensive note for details. To further investigate description of palpitations, cardiac monitoring was undertaken.  Tracings from 21 day cardiac monitor reviewed. Predominant rhythm is sinus, also sinus tachycardia. On day 7 of 21 she had an episode of profound bradycardia with pause in the early a.m. hours, not clearly symptomatic. She was seen by Dr. Ladona Ridgel for EP consultation and beta blocker was decreased. Interestingly, on day 13 of 21 she also had SVT at 150 - 160 beats per minute, lasting 3-4 minutes. Although sinus tachycardia possible, cannot exclude atrial tachycardia or atypical flutter with different P-wave morphology. The strips are pending review by Dr. Ladona Ridgel.  Claire Mcbride tells me that she is doing reasonably well, still feels an "arrhythmia," she describes more of a hesitation and skip than necessarily rapid palpitations, but difficult to be sure. She has had no syncope. She still undergoing treatments for breast cancer.  Today we discussed her monitor results, the finding of significant bradycardia while she was sleeping on one occasion, also episodes of tachycardia which I suspect may actually be more symptom provoking for her. She is now taking atenolol 50 mg, one half tablet daily. I discussed either modifying this dose or adding digoxin. We have decided to go with atenolol 37.5 mg daily for now, and also followup by Dr. Lubertha Basque recommendations.  In discussing her episode of nocturnal bradycardia. She tells me that she did have a sleep study approximately 3 years ago and was told that she had restless legs, but not frank sleep apnea.   Allergies  Allergen Reactions  . Bee Venom   . Diclofenac   . Lidocaine   . Lisinopril   . Losartan   . Other     IV electrolyte solution with perservates  . Penicillins   . Protonix [Pantoprazole Sodium]   .  Relpax [Eletriptan]   . Zantac [Ranitidine Hcl]     Current Outpatient Prescriptions  Medication Sig Dispense Refill  . atenolol (TENORMIN) 25 MG tablet Take 1.5 tablets (37.5 mg total) by mouth daily.  45 tablet  6  . atorvastatin (LIPITOR) 10 MG tablet Take 10 mg by mouth daily.      Marland Kitchen buPROPion (WELLBUTRIN XL) 300 MG 24 hr tablet Take 300 mg by mouth daily.      . Cholecalciferol (VITAMIN D-3 PO) Take 50,000 Int'l Units by mouth every 14 (fourteen) days.      . clonazePAM (KLONOPIN) 1 MG tablet Take 1 mg by mouth 2 (two) times daily. 1 tablet in AM and 2 tablets at PM      . cyclophosphamide (CYTOXAN) 2 G chemo injection Inject into the vein every 14 (fourteen) days.       Marland Kitchen DOXOrubicin HCl (ADRIAMYCIN IV) Inject into the vein every 14 (fourteen) days.       Marland Kitchen EPINEPHrine (EPIPEN) 0.3 mg/0.3 mL SOAJ injection Inject into the muscle as needed.      Marland Kitchen glimepiride (AMARYL) 2 MG tablet Take 4 mg by mouth daily before breakfast.       . lamoTRIgine (LAMICTAL) 100 MG tablet Take 100 mg by mouth 2 (two) times daily.      Marland Kitchen omeprazole (PRILOSEC) 40 MG capsule Take 40 mg by mouth daily.      . pegfilgrastim (NEULASTA) 6 MG/0.6ML injection Inject 6 mg into the vein every 14 (fourteen) days.      Marland Kitchen  tamoxifen (NOLVADEX) 20 MG tablet Take 1 tablet (20 mg total) by mouth daily.  30 tablet  0   No current facility-administered medications for this visit.    Past Medical History  Diagnosis Date  . Breast cancer   . Bipolar disorder   . Psoriasis   . Type 2 diabetes mellitus   . Essential hypertension, benign   . Mixed hyperlipidemia     Social History Claire Mcbride reports that she quit smoking about 6 weeks ago. Her smoking use included Cigarettes. She has a 15 pack-year smoking history. She does not have any smokeless tobacco history on file. Claire Mcbride reports that she does not drink alcohol.  Review of Systems Negative except as outlined.  Physical Examination Filed Vitals:    07/29/13 1504  BP: 151/79  Pulse: 94   Filed Weights   07/29/13 1504  Weight: 202 lb 12.8 oz (91.989 kg)    Overweight woman, appears comfortable at rest.  HEENT: Conjunctiva and lids normal, oropharynx clear.  Neck: Supple, no elevated JVP or carotid bruits, no thyromegaly.  Lungs: Clear to auscultation, nonlabored breathing at rest.  Cardiac: Regular rate and rhythm, no S3 or significant systolic murmur, no pericardial rub.  Extremities: No pitting edema, distal pulses 2+.  Neuropsychiatric: Alert and oriented x3, affect grossly appropriate.   Problem List and Plan   PSVT (paroxysmal supraventricular tachycardia) Patient's monitor shows sinus tachycardia at times, cannot entirely exclude PSVT or even atrial tachycardia/flutter. For now will increase her atenolol to 37.5 mg daily. I have asked Dr. Ladona Ridgel to review the strips to see if he has any other EP recommendations. With an episode of significant bradycardia also documented, this may make treatment options more challenging.  Palpitations Presumably related to tachycardia and/or PSVT as noted. We will try to cautiously advance her beta blocker, but not back to her prior dose.  Bradycardia Episode of nocturnal bradycardia/pause noted. She has had no episodes during daytime hours by monitoring, no syncope. She tells me that she had a sleep study 3 years ago that did not demonstrate frank sleep apnea. I have already had her evaluated by Dr. Ladona Ridgel.    Jonelle Sidle, M.D., F.A.C.C.

## 2013-07-30 ENCOUNTER — Telehealth: Payer: Self-pay | Admitting: *Deleted

## 2013-08-02 ENCOUNTER — Other Ambulatory Visit: Payer: Self-pay | Admitting: *Deleted

## 2013-08-02 ENCOUNTER — Telehealth: Payer: Self-pay | Admitting: *Deleted

## 2013-08-02 NOTE — Telephone Encounter (Signed)
This RN spoke with pt per her call today stating request for an emergent referral to a Dr Sunny Schlein in [place] for her oncology care. She states she needs emergent referral because she has to receive chemo for a known breast cancer.  Note this RN spoke with this patient late Friday afternoon when she called stating she was upset with care in [place] due to not being to communicate and obtain appointments. She says her 55 year father has to drive her and appointments get cancelled and rescheduled and this is too much for her.  Today this RN stated to pt need for above request to be managed by the [facility] clinic due to she is actively under their care and they have all her treatment records and known chemo regiman. This RN also iterated that it may be difficult to obtain an appointment as new patient and receive a treatment this week and that it may be better for her to complete a treatment and then get referred.  Rumor stated " well I was told by that doctors office that if my care was emergent they could see me and do the treatment but the doctor in [place] would need to speak with their doctor and since Dr Darnelle Catalan sent me to [facility] I thought he could refer me".  This RN again informed pt to call and request to speak to the nurse manager in [place] to discuss her request for a referral to another oncology office. Pt can be seen by Dr Thea Silversmith if needed but he does not have openings this week that would allow her to get treated here.  Pt states she will try to " get through to someone in [place] but I get VM's ". Note pt left VM's with this RN as well.  This note will be communicated to MD as well as [place] notified to assist with pt's concerns.

## 2013-08-05 ENCOUNTER — Telehealth: Payer: Self-pay | Admitting: *Deleted

## 2013-08-05 NOTE — Telephone Encounter (Deleted)
Message copied by Lesle Chris on Thu Aug 05, 2013 11:29 AM ------      Message from: MCDOWELL, Illene Bolus      Created: Thu Aug 05, 2013  8:04 AM       I reviewed Dr. Lubertha Basque recommendations. We will continue strategy of observation on medical therapy for now. We just recently increased beta blocker somewhat at last visit. ------

## 2013-08-05 NOTE — Telephone Encounter (Signed)
Notes Recorded by Lesle Chris, LPN on 40/98/1191 at 11:28 AM Patient notified.

## 2013-08-05 NOTE — Telephone Encounter (Signed)
Message copied by Eustace Moore on Thu Aug 05, 2013 10:46 AM ------      Message from: MCDOWELL, Illene Bolus      Created: Thu Aug 05, 2013  8:04 AM       I reviewed Dr. Lubertha Basque recommendations. We will continue strategy of observation on medical therapy for now. We just recently increased beta blocker somewhat at last visit. ------

## 2013-09-30 ENCOUNTER — Ambulatory Visit: Payer: Medicare Other | Admitting: Cardiology

## 2013-11-16 ENCOUNTER — Ambulatory Visit: Payer: Medicare Other | Admitting: Oncology

## 2013-11-23 ENCOUNTER — Other Ambulatory Visit: Payer: Self-pay | Admitting: Oncology

## 2013-11-25 ENCOUNTER — Telehealth: Payer: Self-pay | Admitting: *Deleted

## 2013-11-25 NOTE — Telephone Encounter (Signed)
Pt left message on 3/4 requesting a return call to give update on rash as well as to inquire about notes about radiation, " I lost the notes on radiation ".  This RN attempted to call x 3 with number being busy with each call.

## 2013-11-26 ENCOUNTER — Telehealth: Payer: Self-pay | Admitting: *Deleted

## 2013-11-26 NOTE — Telephone Encounter (Signed)
This patient called to this RN to discuss skin biopsy showing cancer- per message she states " I have a new cancer - it is adenocarcinoma and I have been told I have 18 months to live ".  This RN returned call and discussed above with pt.  Evey states she had the biopsy done in [facility] by Dr Johney Maine at [facility]. She states " his assistant is the one who called me but really did not tell me much so I had my daddy go and get me a copy of the report "  Lakoda states the report says " metastatic breast cancer ".  Francenia states she has called her attending oncologist, Dr Lorn Junes " 17 times " and is awaiting a return call from him.  Per call Kristell states she has had 16 IV treatments and now has 3 new places of cancer in her body and now this area on the skin- she is inquiring " what else can they do ".  Per discussion this RN encouraged pt to speak with her attending MD about possible treatment options which may include need for radiation.  Arbie Cookey asked " could Dr Jana Hakim speak with Dr Lorn Junes to tell him what to do "  This RN informed pt Dr Jana Hakim has not seen pt recently, nor does he have any updated records to review for his opinion on treatment options. Reiterated need to speak with her attending MD and have records sent to this office if she wants Dr Magrinat's opinion.  This RN also encouraged pt to be careful related to people's opinion regarding life expectancy - at present she needs to discuss this new finding with attending MD for best plan of care- especially related to her known transportation issues.   At present - Blonnie has no additional questions and she will follow up with Dr Lorn Junes. She understands if she wants to see Dr Jannifer Rodney again for his opinion to call post her discussion with attending MD and appointment can be scheduled.

## 2013-12-01 ENCOUNTER — Other Ambulatory Visit: Payer: Self-pay | Admitting: Oncology

## 2013-12-01 ENCOUNTER — Encounter: Payer: Self-pay | Admitting: *Deleted

## 2013-12-01 DIAGNOSIS — C50419 Malignant neoplasm of upper-outer quadrant of unspecified female breast: Secondary | ICD-10-CM

## 2013-12-02 ENCOUNTER — Telehealth: Payer: Self-pay | Admitting: *Deleted

## 2013-12-02 NOTE — Telephone Encounter (Signed)
Received msg from Dr. Jana Hakim to get in touch with pt to discuss new breast findings.  Called pt and discussed new breast findings.  Pt relate she noticed red rash starting on Feb 13th 2015.  She pointed it out to her oncologist in [place].  They thought it was an infection and gave her some cream and an antibiotic.  She says it has gotten worse and is "red, bumpy, itchy, painful, warm and expanded out".  She relates that it has traveled to her back.  She went to see Claire Mcbride in [place].  He did 2 skin bx which showed breast cancer on 11/23/13.    I called Dr. Clyda Greener office and received path report.  Showed Dr. Jana Hakim.  Per Dr. Jana Hakim ordered CT/Chest and PET.  Scheduled pt to see Claire Mcbride on 12/21/13 with CT/PET in the AM.  Slides and blocks have been requested as well for pathology to review.

## 2013-12-06 ENCOUNTER — Telehealth: Payer: Self-pay | Admitting: Cardiology

## 2013-12-06 DIAGNOSIS — Z09 Encounter for follow-up examination after completed treatment for conditions other than malignant neoplasm: Secondary | ICD-10-CM

## 2013-12-06 DIAGNOSIS — R079 Chest pain, unspecified: Secondary | ICD-10-CM

## 2013-12-06 NOTE — Telephone Encounter (Signed)
Informed pt of below. Pt agreed to schedule echocardiogram to be done on Wednesday 12-22-13 at 9:00 AM,

## 2013-12-06 NOTE — Telephone Encounter (Signed)
Interesting. If she is being treated with Cytoxan, this does have potential cardiotoxicity side effects. She should have an echocardiogram done within a month. Please go ahead and schedule this. I did research fluorouracil, and there are some descriptions of chest pain associated with this. She should keep an eye on her symptoms with her oncologist who is guiding treatment.

## 2013-12-06 NOTE — Telephone Encounter (Signed)
Called pt and pt stated that she is having chest pain on the right side of her heart. Pain does not radiate. Pt rates pain as 6/10. Pt states that she is no having no shortness of breath. Pt states that she is no lightheaded or dizzy. Pt does not have any Nitro. Started two new medication on Thursday 12-02-13: 5 Fluorouracil and Cytoxan. Pt stated that she was told that both medicine can be bad for her heart and this concerns her. Pt was due for two month fu with MD and cancelled due to family emergency. I informed pt to contact PCP or cancer MD.

## 2013-12-06 NOTE — Telephone Encounter (Signed)
Reviewed telephone note. Last saw patient in November 2014. She has no clear history of CAD which is why she is not on nitroglycerin. We have seen her for palpitations, documentation of both bradycardia and some SVT, has been on beta blocker for this. Concern about chemotherapy agents and cardiac status is noted. Main issue would be screening for possible cardiomyopathy, this would typically not be something that would lead to chest pain however. Echocardiogram in September 2014 showed normal LVEF. She will need to have a followup echocardiogram after initiation of recent chemotherapy agents. Would check with oncologist to make sure that echocardiogram has been scheduled.

## 2013-12-06 NOTE — Telephone Encounter (Signed)
Called Dr. Lorn Junes office and spoke with Tammy to see if echocardiogram has been scheduled. Tammy stated that she would consult with nurse and call me back.

## 2013-12-06 NOTE — Telephone Encounter (Signed)
Mrs. Privitera calls in stating that she started having some chest pains around 8am today.  Patient is taking chemo. Patient is requesting that we call Dr. Lorn Junes office and ask for Her recent Bp readings. (office # [phone])

## 2013-12-06 NOTE — Telephone Encounter (Signed)
Claire Mcbride called back and said no echocardiogram will be ordered through cancer MD.

## 2013-12-08 ENCOUNTER — Telehealth: Payer: Self-pay | Admitting: *Deleted

## 2013-12-08 NOTE — Telephone Encounter (Signed)
Patient left message on nurse's vm stating that she was informed recently that she had a LBBB and needed to get an appointment with Dr. Domenic Polite.   Nurse returned called and spoke with patient. Patient informed nurse that she went to ED at [facility] on Monday after she spoke with our office because her chest pain became unbearable. Patient requested an appointment to be worked in with Dr. Domenic Polite since she was told she has new dx of LBBB. Nurse advised patient that she would be given first available with Domenic Polite since she already had an appointment scheduled for an echo. Also nurse advised patient that her records from [facility] would be requested and placed in MD's basket for review, and if he felt she needed an urgent appointment, she would be contacted for sooner appointment. Patient verbalized understanding of plan.

## 2013-12-14 ENCOUNTER — Encounter: Payer: Self-pay | Admitting: Cardiology

## 2013-12-14 ENCOUNTER — Encounter: Payer: Self-pay | Admitting: Physician Assistant

## 2013-12-15 ENCOUNTER — Encounter: Payer: Self-pay | Admitting: Cardiology

## 2013-12-15 NOTE — Progress Notes (Signed)
Records received from ER visit at [facility] from March 16. It is reported that she presented with chest pain at that time. Troponin I level was normal at 0.01, BNP normal at 55, d-dimer normal at 0.48. She did have an ECG done showing apparently new left bundle branch block compared to tracings from last year. Chest x-ray reported minimal bibasilar atelectasis or scarring. CT scan of the chest with contrast did not show evidence of pulmonary embolus or aortic dissection. There was some atherosclerotic calcification within the aorta, overall normal caliber vessel. Prior healed left ninth rib fracture noted. Postsurgical changes within the left breast also noted.

## 2013-12-21 ENCOUNTER — Encounter (HOSPITAL_COMMUNITY): Payer: Self-pay

## 2013-12-21 ENCOUNTER — Ambulatory Visit (HOSPITAL_BASED_OUTPATIENT_CLINIC_OR_DEPARTMENT_OTHER): Payer: Medicare PPO | Admitting: Oncology

## 2013-12-21 ENCOUNTER — Ambulatory Visit (HOSPITAL_COMMUNITY): Payer: Medicare PPO

## 2013-12-21 ENCOUNTER — Encounter (HOSPITAL_COMMUNITY)
Admission: RE | Admit: 2013-12-21 | Discharge: 2013-12-21 | Disposition: A | Discharge status: Home / Self Care | Payer: Medicare PPO | Source: Ambulatory Visit | Attending: Oncology | Admitting: Oncology

## 2013-12-21 ENCOUNTER — Telehealth: Payer: Self-pay | Admitting: Oncology

## 2013-12-21 VITALS — BP 109/72 | HR 101 | Temp 98.8°F | Resp 18 | Ht 65.0 in | Wt 215.2 lb

## 2013-12-21 DIAGNOSIS — L409 Psoriasis, unspecified: Secondary | ICD-10-CM

## 2013-12-21 DIAGNOSIS — C50412 Malignant neoplasm of upper-outer quadrant of left female breast: Secondary | ICD-10-CM

## 2013-12-21 DIAGNOSIS — E782 Mixed hyperlipidemia: Secondary | ICD-10-CM

## 2013-12-21 DIAGNOSIS — R002 Palpitations: Secondary | ICD-10-CM

## 2013-12-21 DIAGNOSIS — Z170 Estrogen receptor positive status [ER+]: Secondary | ICD-10-CM

## 2013-12-21 DIAGNOSIS — C50919 Malignant neoplasm of unspecified site of unspecified female breast: Secondary | ICD-10-CM | POA: Insufficient documentation

## 2013-12-21 DIAGNOSIS — C50219 Malignant neoplasm of upper-inner quadrant of unspecified female breast: Secondary | ICD-10-CM

## 2013-12-21 DIAGNOSIS — I471 Supraventricular tachycardia: Secondary | ICD-10-CM

## 2013-12-21 DIAGNOSIS — F319 Bipolar disorder, unspecified: Secondary | ICD-10-CM

## 2013-12-21 DIAGNOSIS — C50419 Malignant neoplasm of upper-outer quadrant of unspecified female breast: Secondary | ICD-10-CM

## 2013-12-21 DIAGNOSIS — R001 Bradycardia, unspecified: Secondary | ICD-10-CM

## 2013-12-21 DIAGNOSIS — C792 Secondary malignant neoplasm of skin: Secondary | ICD-10-CM

## 2013-12-21 LAB — GLUCOSE, CAPILLARY: GLUCOSE-CAPILLARY: 149 mg/dL — AB (ref 70–99)

## 2013-12-21 MED ORDER — FLUDEOXYGLUCOSE F - 18 (FDG) INJECTION
9.8000 | Freq: Once | INTRAVENOUS | Status: AC | PRN
  Administered 2013-12-21: 9.8 via INTRAVENOUS

## 2013-12-21 NOTE — Progress Notes (Signed)
Patient ID: Claire Mcbride, female   DOB: 1961-08-06, 53 y.o.   MRN: [id number] ID: Claire Mcbride OB: 31-May-1961  MR#: [id number]  CSN#:[id number]  PCP: Joseph Art, MD GYN:   SU:  OTHER MD: Jamal Collin. Fintel, Caren Horton Marshall NP, Gwenyth Bouillon Rae Roam, Jae Dire   HISTORY OF PRESENT ILLNESS: Trula had routine screening mammography 06/09/2012 at [facility] in [place] showing a nodular density in the left breast associated with intermediate calcifications. Additional views 07/08/2012 confirmed a persistent spiculated density associated with pleomorphic calcifications. On 08/12/2012 the patient underwent ultrasound-guided biopsy of the left breast mass which measured approximately 8 mm at the 1145 position of the left breast as well as a prominent level I lymph node which showed diffuse cortical thickening. The pathology from this procedure (accession [id number] at the [facility] in [place]) showed in both the breast mass and lymph node and invasive ductal carcinoma, grade 3, estrogen receptor 10% positive at 1+, and progesterone receptor 15% positive, also at 1+ HER-2 was negative.  On 08/26/2012 the patient underwent bilateral breast MRI confirming an irregular enhancing lesion in the upper inner left breast measuring 3.6 cm in maximal dimensions. The biopsied level I node previously noted measured 2.4 cm. There were other adjacent irregular nodes which were also suspicious no other significant findings were noted. The patient was treated neoadjuvantly with tamoxifen started January 2014.  On 03/17/2013 the patient proceeded to right simple mastectomy and axillary lymph node sampling and left modified radical mastectomy under Dr. Jimmye Norman. The final pathology from this procedure ([id number]) showed, in the right breast, no evidence of malignancy and 5 lymph nodes clear.  In the left breast, there was a 2.3 cm  invasive ductal carcinoma, grade 3, involving 14 of 16 axillary lymph nodes sampled. 2 of these were micrometastatic metastases. There was no definite extracapsular extension. The pathologic stage accordingly was pT2 pN3 or stage IIIC.   The patient met with Dr. Lorene Dy 03/22/2013 to discuss systemic therapy, and his suggestion was that she continue on tamoxifen. The patient is here today for a second opinion.  INTERVAL HISTORY: Claire Mcbride returns today for followup of her breast cancer accompanied by her father had Claire Mcbride. Since her last visit here she has undergone treatment under Dr. Lorn Junes in [place] and to the best of my ability to reconstruct her care (some of which was given in [facility]) she received 4 cycles of cyclophosphamide and doxorubicin followed by 11 weekly doses of Taxol. Despite this optimal treatment, she has had progressive skin involvement in the left superior chest wall area. She was started on oral CMF (cyclophosphamide, methotrexate and fluorouracil) and is currently in the middle of her second cycle.  REVIEW OF SYSTEMS: Claire Mcbride is tolerating her current chemotherapy well. She denies problems with nausea or vomiting. She is having some dull headaches. There've been no visual changes, no dizziness, or gait imbalance. There has been no change in bowel or bladder habits. Her biggest problem is itching of her involved chest wall skin. She is understandably anxious. A detailed review of systems otherwise was stable  PAST MEDICAL HISTORY: Past Medical History  Diagnosis Date  . Breast cancer   . Bipolar disorder   . Psoriasis   . Type 2 diabetes mellitus   . Essential hypertension, benign   . Mixed hyperlipidemia    The patient tells me she has undergone abdominal or laparoscopy to remove some scar tissue in has  a right ovarian chronic pain possibly associated with that. She had an overdose of medication 2006. She had a left breast cyst removed in 2003. She tells me  that she has bipolar disorder and schizoaffective disorder. She is followed at the [facility] by Mallie Mussel, a nurse practitioner in [place] [phone]).  PAST SURGICAL HISTORY: Past Surgical History  Procedure Laterality Date  . Breast surgery Bilateral    Status post bilateral mastectomies with left axillary lymph node dissection  FAMILY HISTORY Family History  Problem Relation Age of Onset  . Breast cancer Mother    The patient's parents are living, in their mid 16s. The patient has one brother and one sister. The patient's mother was diagnosed with in situ breast cancer at the age of 57. There are some cousins with a diagnosis of breast and other cancers, but no other first degree relative with cancer and no history of ovarian cancer in the family. The patient was tested for the BRCA gene and was found to be negative  GYNECOLOGIC HISTORY:   menarche age 66, first live birth age 66, the patient is GX P1. She stopped having periods in 2008.  SOCIAL HISTORY:  The patient is disabled secondary to her psychiatric history. She is divorced. Her mother is with her during this visit. The patient's daughter Claire Mcbride lives in [place] where she is a housewife. The patient has 2 grandchildren. She attends a [employer], molecular of clot.    ADVANCED DIRECTIVES: The patient stated that she completed advanced directives at the time of her mastectomy. With further questioning, what she explained today is that she does not desire futile care or death prolonging care. She would agree to life support if it was instituted with the expectation that she would survive at with an adequate quality of life. The patient's mother was present during this discussion, which took place 06/01/2013   HEALTH MAINTENANCE: History  Substance Use Topics  . Smoking status: Former Smoker -- 0.50 packs/day for 30 years    Types: Cigarettes    Quit date:  06/12/2013  . Smokeless tobacco: Not on file  . Alcohol Use: No   she smokes approximately 1/2 packs of cigarettes daily, but is planning to switch to an electronic non-nicotine cigarettes. The patient was strongly advised to quit smoking altogether and was offered participation in local smoking cessation programs.   Colonoscopy:-  PAP: 2012  Bone density:  Lipid panel:  Allergies  Allergen Reactions  . Bee Venom   . Diclofenac   . Lidocaine   . Lisinopril   . Losartan   . Other     IV electrolyte solution with perservates  . Penicillins   . Protonix [Pantoprazole Sodium]   . Relpax [Eletriptan]   . Zantac [Ranitidine Hcl]     Current Outpatient Prescriptions  Medication Sig Dispense Refill  . atenolol (TENORMIN) 25 MG tablet Take 1.5 tablets (37.5 mg total) by mouth daily.  45 tablet  6  . atorvastatin (LIPITOR) 10 MG tablet Take 10 mg by mouth daily.      Marland Kitchen buPROPion (WELLBUTRIN XL) 300 MG 24 hr tablet Take 300 mg by mouth daily.      . Cholecalciferol (VITAMIN D-3 PO) Take 50,000 Int'l Units by mouth every 14 (fourteen) days.      . clonazePAM (KLONOPIN) 1 MG tablet Take 1 mg by mouth 2 (two) times daily. 1 tablet in AM and 2 tablets at PM      .  cyclophosphamide (CYTOXAN) 2 G chemo injection Inject into the vein every 14 (fourteen) days.       Marland Kitchen DOXOrubicin HCl (ADRIAMYCIN IV) Inject into the vein every 14 (fourteen) days.       Marland Kitchen EPINEPHrine (EPIPEN) 0.3 mg/0.3 mL SOAJ injection Inject into the muscle as needed.      Marland Kitchen glimepiride (AMARYL) 2 MG tablet Take 4 mg by mouth daily before breakfast.       . lamoTRIgine (LAMICTAL) 100 MG tablet Take 100 mg by mouth 2 (two) times daily.      Marland Kitchen omeprazole (PRILOSEC) 40 MG capsule Take 40 mg by mouth daily.      . pegfilgrastim (NEULASTA) 6 MG/0.6ML injection Inject 6 mg into the vein every 14 (fourteen) days.      . tamoxifen (NOLVADEX) 20 MG tablet Take 1 tablet (20 mg total) by mouth daily.  30 tablet  0   No current  facility-administered medications for this visit.    OBJECTIVE: Middle-aged white woman in no acute distress Filed Vitals:   12/21/13 1223  BP: 109/72  Pulse: 101  Temp: 98.8 F (37.1 C)  Resp: 18     Body mass index is 35.81 kg/(m^2).    ECOG FS:1 - Symptomatic but completely ambulatory  Sclerae unicteric, EOMs intact, pupils round and equal Oropharynx clear and moist No cervical or supraclavicular adenopathy Lungs no rales or rhonchi Heart regular rate and rhythm Abd soft, obese, nontender, positive bowel sounds MSK no focal spinal tenderness, no left upper extremity lymphedema Neuro: non-focal, well-oriented (place, date and person), appropriate affect: Breasts: Status post bilateral mastectomies. There is a large area of skin involvement which has been biopsy-proven to be metastatic breast cancer. This extends around the left flank and also slightly across the midline to the right. The left axilla is benign.   LAB RESULTS:  CMP  No results found for this basename: na,  k,  cl,  co2,  glucose,  bun,  creatinine,  calcium,  prot,  albumin,  ast,  alt,  alkphos,  bilitot,  gfrnonaa,  gfraa    I No results found for this basename: SPEP,  UPEP,   kappa and lambda light chains    Lab Results  Component Value Date   WBC 4.7 06/10/2013      Chemistry   No results found for this basename: NA,  K,  CL,  CO2,  BUN,  CREATININE,  GLU   No results found for this basename: CALCIUM,  ALKPHOS,  AST,  ALT,  BILITOT       No results found for this basename: LABCA2    No components found with this basename: SFKCL275    No results found for this basename: INR,  in the last 168 hours  Urinalysis No results found for this basename: colorurine,  appearanceur,  labspec,  phurine,  glucoseu,  hgbur,  bilirubinur,  ketonesur,  proteinur,  urobilinogen,  nitrite,  leukocytesur    STUDIES: Nm Pet Image Restag (ps) Skull Base To Thigh  12/21/2013   CLINICAL DATA:  Subsequent  treatment strategy for breast cancer.  EXAM: NUCLEAR MEDICINE PET SKULL BASE TO THIGH  TECHNIQUE: 9.8 mCi F-18 FDG was injected intravenously. Full-ring PET imaging was performed from the skull base to thigh after the radiotracer. CT data was obtained and used for attenuation correction and anatomic localization.  FASTING BLOOD GLUCOSE:  Value: 149 mg/dl  COMPARISON:  NM PET IMAGE INITIAL (PI) SKULL BASE TO THIGH dated 06/22/2013  FINDINGS: NECK  Prior hypermetabolic left supraclavicular adenopathy resolved.  CHEST  Left subpectoral lymph node, 7 mm in short axis, maximum standard uptake value 3.9, formerly 1.8 cm in short axis with maximum standard uptake value 13.4.  Left axillary lymph node short axis diameter 1.3 cm with maximum standard uptake value 4.1, previous axillary nodes up to 1.4 cm and previous maximum axillary SUV activity at 11.2.  Postoperative findings in the left breast noted with maximum standard uptake value 4.5, previously 4.5, with complex fluid collection in the vicinity.  Left internal mammary lymph node short axis diameter 1.0 cm, maximum standard uptake value 4.7, previous maximum standard uptake value in this vicinity 2.2.  ABDOMEN/PELVIS  No abnormal hypermetabolic activity within the liver, pancreas, adrenal glands, or spleen. No hypermetabolic lymph nodes in the abdomen or pelvis. IUD noted in the uterus.  SKELETON  No focal hypermetabolic activity to suggest skeletal metastasis.  IMPRESSION: 1. Primary pattern is of improvement, with resolve left supraclavicular adenopathy and reduced size and activity of the subpectoral and axillary lymph nodes on the left. However, there is a left internal mammary lymph node which is more prominent than on the prior examination and has clear increase in standard uptake value from prior the background level activity measurement of 2.2 to current level of 4.7.   Electronically Signed   By: Sherryl Barters M.D.   On: 12/21/2013 10:22   ASSESSMENT: 53  y.o. BRCA negative [place], [place] woman status post left breast biopsy 08/20/2012 for a clinical T2 N1, stage IIB invasive ductal carcinoma, grade 3, estrogen receptor positive at 10%, progesterone receptor positive at 15%, with no HER-2 amplification  (1) treated neoadjuvantly with tamoxifen started January 2014  (2) status post left modified radical mastectomy 03/17/2013 for a pT2 pN3, stage IIC invasive ductal carcinoma, grade 3 [concurrent right simple mastectomy and axillary lymph node sampling on the right was benign]  (3) adjuvant therapy consisted of doxorubicin and cyclophosphamide in dose dense fashion x4 followed by weekly paclitaxel x11 completed February 2015  (4) skin biopsy 11/23/2013 showed metastatic breast carcinoma estrogen and progesterone receptor negative, HER-2 equivocal by immunohistochemistry but negative by FISH  (5) on "oral CMF" (cyclophosphamide, methotrexate, fluorouracil) started February 2015  PLAN: We spent approximately 1 hour going over Yajahira's situation today. She understands the combination of Cytoxan, Adriamycin and Taxol is optimal. We do not have a better treatment. The fact that her cancer recurred on her skin (and persists in regional lymph nodes according to our PET scan) is very unfavorable. She understands she now has stage IV (incurable) disease.  I think the choice of oral CMF is excellent. She is currently in the middle of cycle 2. If there is no evidence of response by the end of cycle 3 I would suggest consideration of a clinical trial. Specifically I suggested referral to [facility] where in addition to obtaining a second opinion and discussing possible targeted treatments she would be able to participate in [place]. All this was discussed with her and she has a good understanding of what is involved. She desires to have a referral placed and I am doing that today.  Tomeka understands in my experience skin involvement is very difficult to treat. If she does  get a good systemic response possibly she might benefit from radiation at some point, or it might be considered for palliative purposes given the fact that she is itchy uncomfortable because of the skin involvement. It is very favorable that she does not  have lung liver or bone involvement as far as we can see. I am concerned about her headaches and I think a brain MRI in the near future would be reasonable.  I am making Palmina a return appointment here in approximately 3 months but she knows that we will be glad to see her earlier or move that appointment back as needed.   Chauncey Cruel, MD   12/21/2013 1:31 PM

## 2013-12-21 NOTE — Telephone Encounter (Signed)
, °

## 2013-12-22 ENCOUNTER — Other Ambulatory Visit: Payer: Self-pay | Admitting: Oncology

## 2013-12-22 ENCOUNTER — Other Ambulatory Visit: Payer: Medicare Other

## 2013-12-22 ENCOUNTER — Encounter: Payer: Self-pay | Admitting: Oncology

## 2013-12-22 ENCOUNTER — Other Ambulatory Visit: Payer: Self-pay

## 2013-12-22 ENCOUNTER — Other Ambulatory Visit (INDEPENDENT_AMBULATORY_CARE_PROVIDER_SITE_OTHER): Payer: Medicare PPO

## 2013-12-22 DIAGNOSIS — I471 Supraventricular tachycardia: Secondary | ICD-10-CM

## 2013-12-22 DIAGNOSIS — Z09 Encounter for follow-up examination after completed treatment for conditions other than malignant neoplasm: Secondary | ICD-10-CM

## 2013-12-22 DIAGNOSIS — R079 Chest pain, unspecified: Secondary | ICD-10-CM

## 2013-12-23 ENCOUNTER — Other Ambulatory Visit: Payer: Self-pay | Admitting: Oncology

## 2013-12-24 ENCOUNTER — Encounter: Payer: Self-pay | Admitting: *Deleted

## 2013-12-24 ENCOUNTER — Encounter: Payer: Self-pay | Admitting: Oncology

## 2013-12-24 NOTE — Progress Notes (Signed)
Downloaded pic, printed and took to [place] in [facility] to scan.

## 2013-12-27 ENCOUNTER — Telehealth: Payer: Self-pay | Admitting: *Deleted

## 2013-12-27 NOTE — Telephone Encounter (Signed)
Notes Recorded by Laurine Blazer, LPN on 05/28/7095 at 28:36 PM Patient notified and verbalized understanding. Already has follow up scheduled with Dr. Domenic Polite for 01/12/2014.

## 2013-12-27 NOTE — Telephone Encounter (Signed)
Message copied by Laurine Blazer on Mon Dec 27, 2013 12:04 PM ------      Message from: Satira Sark      Created: Sun Dec 26, 2013  8:57 PM       Patient is undergoing chemotherapy. Study indicates normal LVEF 55-60% range. Can review further at followup. ------

## 2013-12-30 ENCOUNTER — Ambulatory Visit: Payer: Medicare Other | Admitting: Cardiology

## 2014-01-03 ENCOUNTER — Telehealth: Payer: Self-pay | Admitting: *Deleted

## 2014-01-03 NOTE — Telephone Encounter (Signed)
Pt left message on voicemail stating concern that she proceed to [facility] for consultation ( discussed at MD visit ) sooner then 6 weeks " because the areas on my skin are getting worse and the bumps and getting bigger and more of them ". " I am now in more pain and having to use morphine ".  Pt left return call number as [phone].  This note will be given to MD for follow up.

## 2014-01-11 ENCOUNTER — Encounter: Payer: Self-pay | Admitting: Oncology

## 2014-01-12 ENCOUNTER — Ambulatory Visit: Payer: Medicare PPO | Admitting: Cardiology

## 2014-01-14 ENCOUNTER — Other Ambulatory Visit: Payer: Self-pay | Admitting: *Deleted

## 2014-01-14 ENCOUNTER — Encounter: Payer: Self-pay | Admitting: *Deleted

## 2014-01-14 ENCOUNTER — Encounter: Payer: Self-pay | Admitting: Oncology

## 2014-01-17 ENCOUNTER — Telehealth: Payer: Self-pay | Admitting: Oncology

## 2014-01-17 NOTE — Telephone Encounter (Signed)
[facility] to schedule pt appt for a second opinion. I faxed pt  medical records. The nurse will triage records and call pt with appt. Pt is aware

## 2014-01-21 ENCOUNTER — Telehealth: Payer: Self-pay | Admitting: *Deleted

## 2014-01-21 NOTE — Telephone Encounter (Signed)
Pt left message stating she has not heard from [facility] yet - " but last night I began having a lot of pain in my back and chest and it made me have to take little breaths because it hurt so bad "  " please call me - I am begging you to help me "  Note per message pt voice was regular in tone and rate with no noted SOB.  This RN returned call to pt, and spoke with her. Denina states she was seen by " a doctor here who gave me MSO4 10mg  and it helped a lot " Pt has no SOB at this time.  She has additional medications she can take for her pain.  Per referral to [facility] this RN informed pt follow up will be made on Monday regarding when they can see her.  Pt has no other needs at this time and understands she can go to the ER in her area [facility] [place] ) if pain not controlled or she feels she cannot breath well.

## 2014-01-24 ENCOUNTER — Telehealth: Payer: Self-pay | Admitting: Oncology

## 2014-01-24 NOTE — Telephone Encounter (Signed)
Anise Salvo at [facility] to follow up on new pt appt. Left vm to return call.

## 2014-01-25 ENCOUNTER — Telehealth: Payer: Self-pay | Admitting: Oncology

## 2014-01-25 ENCOUNTER — Telehealth: Payer: Self-pay | Admitting: *Deleted

## 2014-01-25 NOTE — Telephone Encounter (Signed)
No calls regarding [facility] request for second opinion have been noted from this office or patient. Again, sending URGENT request for consultation with [employer] including demographics and notes/path

## 2014-01-25 NOTE — Telephone Encounter (Signed)
Called [place] @ [facility] left vm to return call in ref to np appt.

## 2014-01-26 ENCOUNTER — Telehealth: Payer: Self-pay | Admitting: Oncology

## 2014-01-26 NOTE — Telephone Encounter (Signed)
Pt appt to see Dr Wetzel Bjornstad @ [facility] is 02/09/14@12 :78. Dr. Ula Lingo office notified pt in ref to appt.

## 2014-01-31 ENCOUNTER — Ambulatory Visit (INDEPENDENT_AMBULATORY_CARE_PROVIDER_SITE_OTHER): Payer: Medicare PPO | Admitting: Cardiology

## 2014-01-31 ENCOUNTER — Encounter: Payer: Self-pay | Admitting: Cardiology

## 2014-01-31 VITALS — BP 124/53 | HR 88 | Ht 65.0 in | Wt 213.0 lb

## 2014-01-31 DIAGNOSIS — I471 Supraventricular tachycardia: Secondary | ICD-10-CM

## 2014-01-31 DIAGNOSIS — R001 Bradycardia, unspecified: Secondary | ICD-10-CM

## 2014-01-31 DIAGNOSIS — I498 Other specified cardiac arrhythmias: Secondary | ICD-10-CM

## 2014-01-31 DIAGNOSIS — C50419 Malignant neoplasm of upper-outer quadrant of unspecified female breast: Secondary | ICD-10-CM

## 2014-01-31 DIAGNOSIS — R0789 Other chest pain: Secondary | ICD-10-CM

## 2014-01-31 DIAGNOSIS — C50412 Malignant neoplasm of upper-outer quadrant of left female breast: Secondary | ICD-10-CM

## 2014-01-31 NOTE — Assessment & Plan Note (Signed)
Based on prior cardiac monitoring. Plan is to continue beta blocker therapy. She had EP evaluation by Dr. Lovena Le who also recommended conservative management.

## 2014-01-31 NOTE — Progress Notes (Signed)
Clinical Summary Claire Mcbride is a 53 y.o.female last seen back in November 2014. We have seen her with history of probable PSVT, also nocturnal bradycardia and pauses which have been followed conservatively, no clear indication for EP intervention as per prior consultation with Dr. Lovena Le.  She has breast cancer, followed by Dr. Lorn Junes in [place] as well as Dr. Jana Hakim in [place]. My understanding is that she has been treated with 5-FU as well as Cytoxan, has pending evaluation next week with an oncologist at [facility]. She tells me that her treatments have not been effective so far and a second opinion is being sought.  She is here with her father today. She complains of recurring thoracic discomfort, described as a "pinching" that is brief in duration and without precipitant. This occurs at nighttime and she gets up to "walk the floor" and also "coughs sometimes" to clear the discomfort. No specific relation with any associated palpitations however to suspect arrhythmia. Symptoms are not purely exertional, and typically are very brief in duration. In speaking with her, she seems to be convinced that this is "heart pain" and asked me to prescribe a pain medicine for this. She is concerned about taking morphine, and feels that this is causing "heart side effects." She is tearful in describing her symptoms.  Records received from ER visit at [facility] from March 16. It is reported that she presented with chest pain at that time. Troponin I level was normal at 0.01, BNP normal at 55, d-dimer normal at 0.48. She did have an ECG done showing apparently new left bundle branch block compared to tracings from last year. Chest x-ray reported minimal bibasilar atelectasis or scarring. CT scan of the chest with contrast did not show evidence of pulmonary embolus or aortic dissection. There was some atherosclerotic calcification within the aorta, overall normal caliber vessel. Prior  healed left ninth rib fracture noted. Postsurgical changes within the left breast also noted.  Followup echocardiogram done in April of this year revealed LVEF 55-60% with grade 1 diastolic dysfunction with significant basal septal hypertrophy, mildly dilated left atrium.  She had a myocardial perfusion study performed at [facility] in June 2014, the report of which indicates no inducible ischemia, LVEF 52%, mild septal/intraseptal thinning, overall low risk study.  Today I had a frank discussion with the patient and her father regarding cardiac testing done to date. Her present symptoms are very atypical for a cardiac etiology, specifically would be unusual for ischemic heart disease symptoms, and also do not fit well with even paroxysmal arrhythmias. I reviewed the reassuring findings of her recent echocardiogram as it relates to ongoing chemotherapy. She seems to be convinced that she has a cardiomyopathy, however this has not been shown to be the case so far.   We also reviewed home heart rate and blood pressure checks, nothing overly worrisome noted. Her systolic blood pressure has been well-controlled in general. Heart rate ranging from the 80s to as high as the 120s reported.   Allergies  Allergen Reactions  . Bee Venom   . Diclofenac   . Lidocaine   . Lisinopril   . Losartan   . Other     IV electrolyte solution with perservates  . Penicillins   . Prednisone Other (See Comments)    Chest pain  . Protonix [Pantoprazole Sodium]   . Relpax [Eletriptan]   . Zantac [Ranitidine Hcl]     Current Outpatient Prescriptions  Medication Sig Dispense  Refill  . atenolol (TENORMIN) 25 MG tablet Take 25 mg by mouth daily.      Marland Kitchen buPROPion (WELLBUTRIN XL) 300 MG 24 hr tablet Take 300 mg by mouth daily.      . Cholecalciferol (VITAMIN D-3 PO) Take 50,000 Int'l Units by mouth every 14 (fourteen) days.      . clonazePAM (KLONOPIN) 1 MG tablet Take 1 mg by mouth 2 (two) times  daily. 1 tablet in AM and 2 tablets at PM      . EPINEPHrine (EPIPEN) 0.3 mg/0.3 mL SOAJ injection Inject into the muscle as needed.      Marland Kitchen glimepiride (AMARYL) 2 MG tablet Take 4 mg by mouth daily before breakfast.       . HYDROcodone-acetaminophen (NORCO/VICODIN) 5-325 MG per tablet       . lamoTRIgine (LAMICTAL) 100 MG tablet Take 100 mg by mouth 2 (two) times daily.      . lansoprazole (PREVACID) 30 MG capsule Take 30 mg by mouth daily at 12 noon. 2 tabs daily      . morphine (MSIR) 15 MG tablet        No current facility-administered medications for this visit.    Past Medical History  Diagnosis Date  . Breast cancer   . Bipolar disorder   . Psoriasis   . Type 2 diabetes mellitus   . Essential hypertension, benign   . Mixed hyperlipidemia     Social History Claire Mcbride reports that she quit smoking about 7 months ago. Her smoking use included Cigarettes. She has a 15 pack-year smoking history. She does not have any smokeless tobacco history on file. Claire Mcbride reports that she does not drink alcohol.  Review of Systems As outlined above.  Physical Examination Filed Vitals:   01/31/14 1551  BP: 124/53  Pulse: 88   Filed Weights   01/31/14 1551  Weight: 213 lb (96.616 kg)    Overweight woman, no distress.  HEENT: Conjunctiva and lids normal, oropharynx clear.  Neck: Supple, no elevated JVP or carotid bruits, no thyromegaly.  Lungs: Clear to auscultation, nonlabored breathing at rest.  Cardiac: Regular rate and rhythm, no S3 or significant systolic murmur, no pericardial rub.  Extremities: No pitting edema, distal pulses 2+.  Neuropsychiatric: Alert and oriented x3, moves all extremities. Calm.   Problem List and Plan   Atypical chest pain Symptoms as outlined above. Description is very atypical for ischemic heart disease as an etiology. She had a recent echocardiogram that shows preserved LVEF, no clear evidence of chemotherapy associated cardiotoxicity - I  discussed this with her. Recent cardiac workup in [place] is noted above, and she also had a negative ischemic workup in June 2014. She has had PSVT noted previously, and remains on beta blocker. She tells me that palpitations are not associated with her thoracic discomfort symptoms. At this point I did not recommend any additional cardiac testing. I explained that if she was not satisfied with this, she could always seek a second opinion from another cardiologist. She asked me today to prescribe a pain medication, and I made it clear that it is not our practice to do that, and that she should discuss it further with her oncologist.  Breast cancer of upper-outer quadrant of left female breast Keep follow up with oncology, also reportedly to be seeking a second opinion at [facility] next week.  PSVT (paroxysmal supraventricular tachycardia) Based on prior cardiac monitoring. Plan is to continue beta blocker therapy. She had  EP evaluation by Dr. Lovena Le who also recommended conservative management.  Bradycardia Noted on  cardiac monitoring in the past, nocturnal, not clearly symptomatic. Also unlikely that this is related to any of her thoracic discomfort.    Satira Sark, M.D., F.A.C.C.

## 2014-01-31 NOTE — Assessment & Plan Note (Signed)
Noted on  cardiac monitoring in the past, nocturnal, not clearly symptomatic. Also unlikely that this is related to any of her thoracic discomfort.

## 2014-01-31 NOTE — Assessment & Plan Note (Signed)
Symptoms as outlined above. Description is very atypical for ischemic heart disease as an etiology. She had a recent echocardiogram that shows preserved LVEF, no clear evidence of chemotherapy associated cardiotoxicity - I discussed this with her. Recent cardiac workup in [place] is noted above, and she also had a negative ischemic workup in June 2014. She has had PSVT noted previously, and remains on beta blocker. She tells me that palpitations are not associated with her thoracic discomfort symptoms. At this point I did not recommend any additional cardiac testing. I explained that if she was not satisfied with this, she could always seek a second opinion from another cardiologist. She asked me today to prescribe a pain medication, and I made it clear that it is not our practice to do that, and that she should discuss it further with her oncologist.

## 2014-01-31 NOTE — Assessment & Plan Note (Signed)
Keep follow up with oncology, also reportedly to be seeking a second opinion at [facility] next week.

## 2014-01-31 NOTE — Patient Instructions (Signed)
Your physician recommends that you schedule a follow-up appointment in:  As needed     Your physician recommends that you continue on your current medications as directed. Please refer to the Current Medication list given to you today.       Thank you for choosing [facility] !         

## 2014-02-10 ENCOUNTER — Encounter: Payer: Self-pay | Admitting: Oncology

## 2014-02-10 NOTE — Telephone Encounter (Signed)
Called patient and informed her to contact [facility] for Dr. Ouida Sills.  Explained that MyChart she selected is for the [place] only.  Advised that she call [facility] about these concerns if she has not set up a MyChart account with [facility].  Says she will call [facility].

## 2014-03-01 ENCOUNTER — Other Ambulatory Visit: Payer: Medicare PPO

## 2014-03-01 ENCOUNTER — Ambulatory Visit: Payer: Medicare PPO | Admitting: Oncology

## 2014-03-08 ENCOUNTER — Encounter: Payer: Self-pay | Admitting: Oncology

## 2014-03-08 ENCOUNTER — Telehealth: Payer: Self-pay | Admitting: *Deleted

## 2014-03-08 NOTE — Telephone Encounter (Signed)
This RN returned call to [place] per her VM and noted email inquiry about transferring care to [facility] from [facility].  Per conversation Claire Mcbride stated " I am getting tomography radiation and you guys have that at your place " Claire Mcbride also states she started IV chemo with Carboplatin last week.  This RN verified with pt availability of radiation via a tomography machine- but due to her being under active therapy she would have to interrupt current regimen to receive treatment here which may affect her outcome. Also discussed this office may not be able to admit her to accommodate daily radiation like [facility] can do.  Per discussion- Claire Mcbride states understanding of above. Discussed with Claire Mcbride difficulty of being in a hospital for lengthy therapy- which Claire Mcbride was able to state " yes- I would like to be back home but I could not drive or get someone to bring me to your office every day "  Plan at present is Claire Mcbride will complete current regimen which she states she is scheduled for until mid July.  If chemotherapy can be accommodated at this office upon discharge that can be arranged.  No other needs at this time.

## 2014-04-06 ENCOUNTER — Encounter: Payer: Self-pay | Admitting: Radiation Oncology

## 2014-04-06 NOTE — Progress Notes (Signed)
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 Consult Slide , Skin, Biopsy, left breast above- INTRAVASCULAR HIGH GRADE CARCINOMA.- SEE COMMENT. 2. Consult Slide , Skin, Biopsy, left breast below- INVASIVE AND INTRAVASCULAR HIGH GRADE CARCINOMA.- SEE COMMENT. 3. Consult Block(s), Left breast skin biopsy- A BLOCK (A) WAS SENT FOR HER 2 AMPLIFICATION BY [facility]. THE RESULTS ARE AS FOLLOWS:- NO AMPLIFICATION OF HER2 WAS DETECTED. THE HER2:CEP 17 RATIO WAS 1.25. Microscopic Comment 1. , 2. Given the patient's clinical history, the findings are consistent with metastatic breast carcinoma. Estrogen receptor, progesterone receptor, and her 2 results are not provided, nor is a tissue block for studies in-house. (JBK:kh 12/08/13) 1 of 2 Amended 03/17/13:Right mastectomy-no malignancy identified Left mastectomy:Invasive ductal carcinoma with large micropapillary areas. 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 Lorn Junes in [place] and some of which her care given in [place],N>c>, received 4 cycles of cyclophosphamide and doxorubicin followed by 11 weeks of Taxol, , progressive skin involvement and was started on CMF ,    Lymphedema issues, if any:   Pain issues, if any:  On fentanyl patches,  On active Psych  management at [place]:  Prior radiation? Yes, Bilateral CW/Lt Flank,Back started 02/22/14-03/29/14  At [facility]   Pacemaker/ICD?  NO  Possible current pregnancy? NO  Is the patient on methotrexate? NO  Current Complaints / other details:  Major depressive disorder( prior suicide attempt with Klonopin overdose)/Anxiety disorder,  Sick sinus syndrome,recurrent migraines,Bipolar, DMtype II, former smoker cigarettes,0.5ppd x 30 years,quit 9/49/97,DK alcohol or illicit drug use, Colonoscopy/pap 2012, left breast cyst removed 2003, hx  pelvic laparoscopy  Mother breast cancer,dx age 62 in situ,  cousins with dx breast cancers, maternal grandmother  Cancer, father dementia,   Rebecca Eaton, RN 04/06/2014,1:49 PM

## 2014-04-07 ENCOUNTER — Ambulatory Visit: Payer: Medicare PPO

## 2014-04-07 ENCOUNTER — Telehealth: Payer: Self-pay | Admitting: *Deleted

## 2014-04-07 ENCOUNTER — Ambulatory Visit
Admission: RE | Admit: 2014-04-07 | Discharge: 2014-04-07 | Disposition: A | Discharge status: Home / Self Care | Payer: Medicare PPO | Source: Ambulatory Visit | Attending: Radiation Oncology | Admitting: Radiation Oncology

## 2014-04-07 HISTORY — DX: Major depressive disorder, single episode, unspecified: F32.9

## 2014-04-07 HISTORY — DX: Personal history of other diseases of the nervous system and sense organs: Z86.69

## 2014-04-07 HISTORY — DX: Gastro-esophageal reflux disease without esophagitis: K21.9

## 2014-04-07 HISTORY — DX: Personal history of other diseases of the circulatory system: Z86.79

## 2014-04-07 HISTORY — DX: Anxiety disorder, unspecified: F41.9

## 2014-04-07 HISTORY — DX: Depression, unspecified: F32.A

## 2014-04-07 HISTORY — DX: Other specified postprocedural states: Z98.890

## 2014-04-07 HISTORY — DX: Allergy, unspecified, initial encounter: T78.40XA

## 2014-04-07 NOTE — Telephone Encounter (Signed)
Spoke with patient's father Mr. Megan Salon.  Patient will see Dr. Pablo Ledger in [place] 04/15/14 at 7:45.

## 2014-04-20 ENCOUNTER — Telehealth: Payer: Self-pay | Admitting: *Deleted

## 2014-04-20 NOTE — Telephone Encounter (Signed)
Pt left message on this RN's VM- requesting an appointment with Dr Jana Hakim to discuss treatment options " Dr Jana Hakim sent me to [facility] and I stayed there for a month for treament and then I was admitted for problems " " I have had 2 thoracentisis and now they are telling me I have less then 6 months to live " " I was wanting to speak with Dr Jana Hakim so he could tell me if there are other studies I could do?"  This RN obtained recent dictation per Dr Unknown Jim visit on 7/24 at [facility].  This note with above dictation will be given to MD for review for appropriate follow up.

## 2014-04-25 ENCOUNTER — Telehealth: Payer: Self-pay | Admitting: *Deleted

## 2014-04-26 ENCOUNTER — Telehealth: Payer: Self-pay

## 2014-04-26 ENCOUNTER — Other Ambulatory Visit: Payer: Self-pay | Admitting: *Deleted

## 2014-04-26 ENCOUNTER — Telehealth: Payer: Self-pay | Admitting: *Deleted

## 2014-04-26 NOTE — Telephone Encounter (Signed)
Scheduled pt for 04/28/14 at 3:30.  Called and left a message for the pt to make her aware.  Called pt's father Effie Shy and confirmed appt w/ him.

## 2014-04-26 NOTE — Telephone Encounter (Signed)
Office notes received from [facility] Dr. Pablo Ledger dtd 04/06/14,  Copy to GM.  Original to scan.

## 2014-04-27 ENCOUNTER — Other Ambulatory Visit: Payer: Self-pay | Admitting: *Deleted

## 2014-04-27 DIAGNOSIS — C50412 Malignant neoplasm of upper-outer quadrant of left female breast: Secondary | ICD-10-CM

## 2014-04-28 ENCOUNTER — Ambulatory Visit (HOSPITAL_BASED_OUTPATIENT_CLINIC_OR_DEPARTMENT_OTHER): Payer: Medicare PPO | Admitting: Oncology

## 2014-04-28 ENCOUNTER — Other Ambulatory Visit: Payer: Medicare PPO

## 2014-04-28 VITALS — BP 110/63 | HR 98 | Temp 97.9°F | Resp 18 | Ht 65.0 in | Wt 198.9 lb

## 2014-04-28 DIAGNOSIS — C50412 Malignant neoplasm of upper-outer quadrant of left female breast: Secondary | ICD-10-CM

## 2014-04-28 DIAGNOSIS — C50219 Malignant neoplasm of upper-inner quadrant of unspecified female breast: Secondary | ICD-10-CM

## 2014-04-28 DIAGNOSIS — C792 Secondary malignant neoplasm of skin: Secondary | ICD-10-CM

## 2014-04-28 DIAGNOSIS — Z170 Estrogen receptor positive status [ER+]: Secondary | ICD-10-CM

## 2014-04-28 DIAGNOSIS — C773 Secondary and unspecified malignant neoplasm of axilla and upper limb lymph nodes: Secondary | ICD-10-CM

## 2014-04-28 DIAGNOSIS — J90 Pleural effusion, not elsewhere classified: Secondary | ICD-10-CM

## 2014-04-28 DIAGNOSIS — K5900 Constipation, unspecified: Secondary | ICD-10-CM

## 2014-04-28 DIAGNOSIS — C50912 Malignant neoplasm of unspecified site of left female breast: Secondary | ICD-10-CM

## 2014-04-28 NOTE — Progress Notes (Signed)
Patient ID: Claire Mcbride, female   DOB: 11/17/1960, 53 y.o.   MRN: [id number] ID: Claire Mcbride OB: 1961/02/06  MR#: [id number]  SUW#:[id number]  PCP: Claire Person, MD GYN:   SU:  OTHER MD: Claire Mcbride, Claire Hollice Gong NP, Claire Mcbride Claire Mcbride, Claire Mcbride, Claire Mcbride  CHIEF COMPLAINT: Metastatic breast cancer with left pleural effusion and skin involvement  CURRENT THERAPY: Supportive care  BREAST CANCER HISTORY: From the initial intake note:  [name] had routine screening mammography 06/09/2012 at [facility] in [place] showing a nodular density in the left breast associated with intermediate calcifications. Additional views 07/08/2012 confirmed a persistent spiculated density associated with pleomorphic calcifications. On 08/12/2012 the patient underwent ultrasound-guided biopsy of the left breast mass which measured approximately 8 mm at the 1145 position of the left breast as well as a prominent level I lymph node which showed diffuse cortical thickening. The pathology from this procedure (accession [id number] at the [facility] in [place]) showed in both the breast mass and lymph node and invasive ductal carcinoma, grade 3, estrogen receptor 10% positive at 1+, and progesterone receptor 15% positive, also at 1+ HER-2 was negative.  On 08/26/2012 the patient underwent bilateral breast MRI confirming an irregular enhancing lesion in the upper inner left breast measuring 3.6 cm in maximal dimensions. The biopsied level I node previously noted measured 2.4 cm. There were other adjacent irregular nodes which were also suspicious no other significant findings were noted. The patient was treated neoadjuvantly with tamoxifen started January 2014.  On 03/17/2013 the patient proceeded to right simple mastectomy and axillary lymph node sampling and left modified radical mastectomy under Dr.  Mayford Mcbride. The final pathology from this procedure ([id number]) showed, in the right breast, no evidence of malignancy and 5 lymph nodes clear.  In the left breast, there was a 2.3 cm invasive ductal carcinoma, grade 3, involving 14 of 16 axillary lymph nodes sampled. 2 of these were micrometastatic metastases. There was no definite extracapsular extension. The pathologic stage accordingly was pT2 pN3 or stage IIIC.   The patient met with Claire Mcbride 03/22/2013 to discuss systemic therapy, and his suggestion was that she continue on tamoxifen.   Her subsequent history is as detailed below.  INTERVAL HISTORY: [name] returns today for followup of her breast cancer accompanied by her father Claire Mcbride. Since her last visit here she has had further chemotherapy with CMF, and then carboplatin and with concurrent irradiation. In June she was found to have a large left pleural effusion which was tapped, and was cytologically positive, the cells again being triple negative. This has reaccumulated and she went to an emergency department in [place] were chest x-ray was obtained and they told her she "needed a tap again". The patient was worked in today to review her overall situation. She tells me she "can't" returned to [facility] because it is too far. She would like to be treated here. The drive from her home in [place] takes her 45 minutes  REVIEW OF SYSTEMS: [name] remains anxious and depressed. She tells me she is having hallucinations. However she denies suicidal thoughts. She complains of chills, insomnia, blurred vision, irregular heartbeats, ankle swelling, chest pain, shortness of breath both at rest and while walking, dry cough, poor appetite, constipation, urinary stress incontinence, skin rash, forgetfulness, headaches, weakness, and but no nausea or vomiting, no confusion, no dizziness or gait imbalance. Despite extensive skin involvement, her pain is  chiefly in the left chest wall. She is on a fentanyl  patch 50 mcg transdermal every 3 days and hydromorphone 8 mg by mouth every 3 hours when necessary, which she takes pretty much around-the-clock. She is severely constipated although she is taking a variety of over-the-counter medications to relieve this. Her bowel movements are scant and hard. A detailed review of systems today was otherwise stable  PAST MEDICAL HISTORY: Past Medical History  Diagnosis Date  . Breast cancer   . Bipolar disorder   . Psoriasis   . Type 2 diabetes mellitus   . Essential hypertension, benign   . Mixed hyperlipidemia   . GERD (gastroesophageal reflux disease)   . Allergy   . Anxiety   . Depression   . Hx of migraines     chronic  . H/O sick sinus syndrome   . Hx of colonoscopy 2012   The patient tells me she has undergone abdominal or laparoscopy to remove some scar tissue in has a right ovarian chronic pain possibly associated with that. She had an overdose of medication 2006. She had a left breast cyst removed in 2003. She tells me that she has bipolar disorder and schizoaffective disorder. She is followed at the [facility] by Claire Mcbride, a nurse practitioner in [place] [phone]).  PAST SURGICAL HISTORY: Past Surgical History  Procedure Laterality Date  . Breast surgery Bilateral    Status post bilateral mastectomies with left axillary lymph node dissection  FAMILY HISTORY Family History  Problem Relation Age of Onset  . Breast cancer Mother    The patient's parents are living, in their mid 24s. The patient has one brother and one sister. The patient's mother was diagnosed with in situ breast cancer at the age of 36. There are some cousins with a diagnosis of breast and other cancers, but no other first degree relative with cancer and no history of ovarian cancer in the family. The patient was tested for the BRCA gene and was found to be negative  GYNECOLOGIC HISTORY:   menarche age 51,  first live birth age 81, the patient is GX P1. She stopped having periods in 2008.  SOCIAL HISTORY:  The patient is disabled secondary to her psychiatric history. She is divorced. Her mother is with her during this visit. The patient's daughter [name] lives in [place] where she is a housewife. The patient has 2 grandchildren. She attends a [employer], molecular of [employer].    ADVANCED DIRECTIVES: The patient stated that she completed advanced directives at the time of her mastectomy. With further questioning, what she explained today is that she does not desire futile care or death prolonging care. She would agree to life support if it was instituted with the expectation that she would survive at with an adequate quality of life. The patient's mother was present during this discussion, which took place 06/01/2013   HEALTH MAINTENANCE: History  Substance Use Topics  . Smoking status: Former Smoker -- 0.50 packs/day for 30 years    Types: Cigarettes    Quit date: 06/12/2013  . Smokeless tobacco: Not on file  . Alcohol Use: No   she smokes approximately 1/2 packs of cigarettes daily, but is planning to switch to an electronic non-nicotine cigarettes. The patient was strongly advised to quit smoking altogether and was offered participation in local smoking cessation programs.   Colonoscopy:-  PAP: 2012  Bone density:  Lipid panel:  Allergies  Allergen Reactions  .  Bee Venom   . Diclofenac   . Lidocaine   . Lisinopril   . Losartan   . Other     IV electrolyte solution with perservates  . Penicillins   . Prednisone Other (See Comments)    Chest pain  . Protonix [Pantoprazole Sodium]   . Relpax [Eletriptan]   . Zantac [Ranitidine Hcl]     Current Outpatient Prescriptions  Medication Sig Dispense Refill  . atenolol (TENORMIN) 25 MG tablet Take 25 mg by mouth daily.      Marland Kitchen buPROPion (WELLBUTRIN XL) 300 MG 24 hr tablet Take 300 mg by mouth daily.      . Cholecalciferol  (VITAMIN D-3 PO) Take 50,000 Int'l Units by mouth every 14 (fourteen) days.      . clonazePAM (KLONOPIN) 1 MG tablet Take 1 mg by mouth 2 (two) times daily. 1 tablet in AM and 2 tablets at PM      . EPINEPHrine (EPIPEN) 0.3 mg/0.3 mL SOAJ injection Inject into the muscle as needed.      Marland Kitchen glimepiride (AMARYL) 2 MG tablet Take 4 mg by mouth daily before breakfast.       . HYDROcodone-acetaminophen (NORCO/VICODIN) 5-325 MG per tablet       . lamoTRIgine (LAMICTAL) 100 MG tablet Take 100 mg by mouth 2 (two) times daily.      . lansoprazole (PREVACID) 30 MG capsule Take 30 mg by mouth daily at 12 noon. 2 tabs daily      . morphine (MSIR) 15 MG tablet        No current facility-administered medications for this visit.    OBJECTIVE: Middle-aged white woman who appears stated age 40 Vitals:   04/28/14 1605  BP: 110/63  Pulse: 98  Temp: 97.9 F (36.6 C)  Resp: 18     Body mass index is 33.1 kg/(m^2).    ECOG FS:2 - Symptomatic, <50% confined to bed  Sclerae unicteric, EOMs intact, pupils round and reactive Oropharynx clear and moist Lungs no rales or rhonchi, dullness throughout the left lung field Heart regular rate and rhythm Abd soft, obese, nontender, positive bowel sounds Neuro: non-focal, oriented x3, anxious but cooperative affect: Breasts: Status post bilateral mastectomies Skin: Extensive involvement as documented below   :      LAB RESULTS: Outside labs reviewed and scanned  CMP  No results found for this basename: na,  k,  cl,  co2,  glucose,  bun,  creatinine,  calcium,  prot,  albumin,  ast,  alt,  alkphos,  bilitot,  gfrnonaa,  gfraa    I No results found for this basename: SPEP,  UPEP,   kappa and lambda light chains    Lab Results  Component Value Date   WBC 4.7 06/10/2013      Chemistry   No results found for this basename: NA,  K,  CL,  CO2,  BUN,  CREATININE,  GLU   No results found for this basename: CALCIUM,  ALKPHOS,  AST,  ALT,  BILITOT        No results found for this basename: LABCA2    No components found with this basename: LABCA125    No results found for this basename: INR,  in the last 168 hours  Urinalysis No results found for this basename: colorurine,  appearanceur,  labspec,  phurine,  glucoseu,  hgbur,  bilirubinur,  ketonesur,  proteinur,  urobilinogen,  nitrite,  leukocytesur    STUDIES: Outside chest x-ray shows complete oh classification  of the left lung, with minimal aeration at the apex  ASSESSMENT: 53 y.o. BRCA negative [place], [place] woman status post left breast biopsy 08/20/2012 for a clinical T2 N1, stage IIB invasive ductal carcinoma, grade 3, estrogen receptor positive at 10%, progesterone receptor positive at 15%, with no HER-2 amplification  (1) treated neoadjuvantly with tamoxifen started January 2014  (2) status post left modified radical mastectomy 03/17/2013 for a pT2 pN3, stage IIIC invasive ductal carcinoma, grade 3 [concurrent right simple mastectomy and axillary lymph node sampling on the right was benign]  (3) adjuvant therapy consisted of doxorubicin and cyclophosphamide in dose dense fashion x4 followed by weekly paclitaxel x11 completed February 2015  (4) skin biopsy 11/23/2013 showed metastatic breast carcinoma estrogen and progesterone receptor negative, HER-2 equivocal by immunohistochemistry but negative by FISH  (5) on "oral CMF" (cyclophosphamide, methotrexate, fluorouracil) started February 2015, with documented progression after 2 cycles.  (6) received weekly carboplatin (AUC 2) with concurrent radiation between 03/02/2014 and 03/11/2014  (7) laparoscopic cholecystectomy or effusion, status post initial thoracentesis 03/15/2014, with malignant cytology, again estrogen, progesterone, and HER-2 negative.   PLAN: We spent a little over an hour reviewing Chloe situation. There are some urgent problems that we need to address and then there is a longer term issue of what to  do about her cancer. The short-term problems include pain. She is currently on fentanyl 150 mcg per hour Transderm every 72 hours, and she is using hydromorphone 8 mg every 3 hours round the clock. With this regimen her pain is moderately well-controlled and we refilled her prescription today.  The narcotics are causing her quite a bit of constipation. She will be on stool softeners 2 tablets twice daily and MiraLAX in addition to the other over-the-counter and herbal interventions she is using. The goal is for her to have soft bowel movements at least every 3 days. If she cannot achieve that with this regimen she will let us know.  Third problem is a recurrent left pleural effusions. Placement of a tunneled catheter has been previously discussed with her and she is now in agreement. We have arranged for that to be done tomorrow. We will need to set up home health to visit her at least 3 times a week initially for drainage, until the family can learn to do it themselves.  Finally I am obtaining a repeat PET scan and scheduling Romana to return to see [place] later this month. If Dariyah wishes to receive more chemotherapy we will consider eribulin versus Doxil at that time.  Katelynne has a good understanding of the overall plan. She agrees with it. She knows a goal of treatment in her case is control. She will call with any problems that may develop before the next visit here.  Chauncey Cruel, MD   04/28/2014 4:17 PM

## 2014-04-29 ENCOUNTER — Encounter (HOSPITAL_COMMUNITY): Payer: Self-pay

## 2014-04-29 ENCOUNTER — Other Ambulatory Visit: Payer: Self-pay | Admitting: Radiology

## 2014-04-29 ENCOUNTER — Ambulatory Visit (HOSPITAL_COMMUNITY)
Admission: RE | Admit: 2014-04-29 | Discharge: 2014-04-29 | Disposition: A | Discharge status: Home / Self Care | Payer: Medicare PPO | Source: Ambulatory Visit | Attending: Oncology | Admitting: Oncology

## 2014-04-29 ENCOUNTER — Other Ambulatory Visit: Payer: Self-pay | Admitting: *Deleted

## 2014-04-29 ENCOUNTER — Encounter (HOSPITAL_COMMUNITY): Payer: Self-pay | Admitting: Pharmacy Technician

## 2014-04-29 DIAGNOSIS — C50919 Malignant neoplasm of unspecified site of unspecified female breast: Secondary | ICD-10-CM | POA: Insufficient documentation

## 2014-04-29 DIAGNOSIS — Z79899 Other long term (current) drug therapy: Secondary | ICD-10-CM | POA: Insufficient documentation

## 2014-04-29 DIAGNOSIS — F411 Generalized anxiety disorder: Secondary | ICD-10-CM | POA: Insufficient documentation

## 2014-04-29 DIAGNOSIS — K219 Gastro-esophageal reflux disease without esophagitis: Secondary | ICD-10-CM | POA: Diagnosis not present

## 2014-04-29 DIAGNOSIS — E782 Mixed hyperlipidemia: Secondary | ICD-10-CM | POA: Insufficient documentation

## 2014-04-29 DIAGNOSIS — Z87891 Personal history of nicotine dependence: Secondary | ICD-10-CM | POA: Insufficient documentation

## 2014-04-29 DIAGNOSIS — F319 Bipolar disorder, unspecified: Secondary | ICD-10-CM | POA: Diagnosis not present

## 2014-04-29 DIAGNOSIS — I10 Essential (primary) hypertension: Secondary | ICD-10-CM | POA: Diagnosis not present

## 2014-04-29 DIAGNOSIS — J910 Malignant pleural effusion: Secondary | ICD-10-CM | POA: Diagnosis not present

## 2014-04-29 DIAGNOSIS — E119 Type 2 diabetes mellitus without complications: Secondary | ICD-10-CM | POA: Insufficient documentation

## 2014-04-29 DIAGNOSIS — C50412 Malignant neoplasm of upper-outer quadrant of left female breast: Secondary | ICD-10-CM

## 2014-04-29 DIAGNOSIS — C50912 Malignant neoplasm of unspecified site of left female breast: Secondary | ICD-10-CM

## 2014-04-29 LAB — CBC WITH DIFFERENTIAL/PLATELET
Basophils Absolute: 0 10*3/uL (ref 0.0–0.1)
Basophils Relative: 0 % (ref 0–1)
EOS ABS: 0.1 10*3/uL (ref 0.0–0.7)
EOS PCT: 2 % (ref 0–5)
HCT: 32.5 % — ABNORMAL LOW (ref 36.0–46.0)
HEMOGLOBIN: 10.3 g/dL — AB (ref 12.0–15.0)
LYMPHS PCT: 16 % (ref 12–46)
Lymphs Abs: 1 10*3/uL (ref 0.7–4.0)
MCH: 28 pg (ref 26.0–34.0)
MCHC: 31.7 g/dL (ref 30.0–36.0)
MCV: 88.3 fL (ref 78.0–100.0)
MONOS PCT: 10 % (ref 3–12)
Monocytes Absolute: 0.6 10*3/uL (ref 0.1–1.0)
Neutro Abs: 4.2 10*3/uL (ref 1.7–7.7)
Neutrophils Relative %: 72 % (ref 43–77)
Platelets: 136 10*3/uL — ABNORMAL LOW (ref 150–400)
RBC: 3.68 MIL/uL — AB (ref 3.87–5.11)
RDW: 17.8 % — ABNORMAL HIGH (ref 11.5–15.5)
WBC: 5.9 10*3/uL (ref 4.0–10.5)

## 2014-04-29 LAB — BASIC METABOLIC PANEL
Anion gap: 13 (ref 5–15)
BUN: 13 mg/dL (ref 6–23)
CHLORIDE: 98 meq/L (ref 96–112)
CO2: 26 mEq/L (ref 19–32)
Calcium: 9.3 mg/dL (ref 8.4–10.5)
Creatinine, Ser: 0.51 mg/dL (ref 0.50–1.10)
GFR calc Af Amer: >90 mL/min (ref >90)
GFR calc non Af Amer: >90 mL/min (ref >90)
GLUCOSE: 83 mg/dL (ref 70–99)
POTASSIUM: 3.9 meq/L (ref 3.7–5.3)
Sodium: 137 mEq/L (ref 137–147)

## 2014-04-29 LAB — GLUCOSE, CAPILLARY: Glucose-Capillary: 76 mg/dL (ref 70–99)

## 2014-04-29 LAB — PROTIME-INR
INR: 1.08 (ref 0.00–1.49)
Prothrombin Time: 14 seconds (ref 11.6–15.2)

## 2014-04-29 LAB — APTT: aPTT: 37 seconds (ref 24–37)

## 2014-04-29 MED ORDER — CHLOROPROCAINE HCL 1 % IJ SOLN: 20.0000 mL | Freq: Once | INTRAMUSCULAR | Status: DC

## 2014-04-29 MED ORDER — CHLOROPROCAINE HCL 1 % IJ SOLN
INTRAMUSCULAR | Status: AC
  Filled 2014-04-29: qty 30

## 2014-04-29 MED ORDER — MIDAZOLAM HCL 2 MG/2ML IJ SOLN
INTRAMUSCULAR | Status: AC
  Filled 2014-04-29: qty 6

## 2014-04-29 MED ORDER — HYDROCODONE-ACETAMINOPHEN 5-325 MG PO TABS: 1.0000 | ORAL_TABLET | ORAL | Status: DC | PRN

## 2014-04-29 MED ORDER — SODIUM CHLORIDE 0.9 % IV SOLN
INTRAVENOUS | Status: DC
  Administered 2014-04-29: 14:00:00 via INTRAVENOUS

## 2014-04-29 MED ORDER — HYDROMORPHONE HCL PF 1 MG/ML IJ SOLN
2.0000 mg | Freq: Once | INTRAMUSCULAR | Status: AC
  Administered 2014-04-29: 2 mg via INTRAVENOUS
  Filled 2014-04-29: qty 2

## 2014-04-29 MED ORDER — VANCOMYCIN HCL IN DEXTROSE 1-5 GM/200ML-% IV SOLN
1000.0000 mg | INTRAVENOUS | Status: AC
  Administered 2014-04-29: 1000 mg via INTRAVENOUS
  Filled 2014-04-29: qty 200

## 2014-04-29 MED ORDER — PROMETHAZINE HCL 25 MG/ML IJ SOLN
INTRAMUSCULAR | Status: AC
  Filled 2014-04-29: qty 1

## 2014-04-29 MED ORDER — MIDAZOLAM HCL 2 MG/2ML IJ SOLN
INTRAMUSCULAR | Status: AC | PRN
  Administered 2014-04-29 (×4): 0.5 mg via INTRAVENOUS

## 2014-04-29 MED ORDER — FENTANYL CITRATE 0.05 MG/ML IJ SOLN
INTRAMUSCULAR | Status: AC
  Filled 2014-04-29: qty 6

## 2014-04-29 MED ORDER — HEPARIN SOD (PORK) LOCK FLUSH 100 UNIT/ML IV SOLN
500.0000 [IU] | INTRAVENOUS | Status: AC | PRN
  Administered 2014-04-29: 500 [IU]
  Filled 2014-04-29: qty 5

## 2014-04-29 MED ORDER — PROMETHAZINE HCL 25 MG/ML IJ SOLN
INTRAMUSCULAR | Status: AC | PRN
  Administered 2014-04-29: 25 mg via INTRAVENOUS

## 2014-04-29 NOTE — Discharge Instructions (Signed)
Conscious Sedation °Sedation is the use of medicines to promote relaxation and relieve discomfort and anxiety. Conscious sedation is a type of sedation. Under conscious sedation you are less alert than normal but are still able to respond to instructions or stimulation. Conscious sedation is used during short medical and dental procedures. It is milder than deep sedation or general anesthesia and allows you to return to your regular activities sooner.  °LET YOUR HEALTH CARE PROVIDER KNOW ABOUT:  °· Any allergies you have. °· All medicines you are taking, including vitamins, herbs, eye drops, creams, and over-the-counter medicines. °· Use of steroids (by mouth or creams). °· Previous problems you or members of your family have had with the use of anesthetics. °· Any blood disorders you have. °· Previous surgeries you have had. °· Medical conditions you have. °· Possibility of pregnancy, if this applies. °· Use of cigarettes, alcohol, or illegal drugs. °RISKS AND COMPLICATIONS °Generally, this is a safe procedure. However, as with any procedure, problems can occur. Possible problems include: °· Oversedation. °· Trouble breathing on your own. You may need to have a breathing tube until you are awake and breathing on your own. °· Allergic reaction to any of the medicines used for the procedure. °BEFORE THE PROCEDURE °· You may have blood tests done. These tests can help show how well your kidneys and liver are working. They can also show how well your blood clots. °· A physical exam will be done.   °· Only take medicines as directed by your health care provider. You may need to stop taking medicines (such as blood thinners, aspirin, or nonsteroidal anti-inflammatory drugs) before the procedure.   °· Do not eat or drink at least 6 hours before the procedure or as directed by your health care provider. °· Arrange for a responsible adult, family member, or friend to take you home after the procedure. He or she should stay  with you for at least 24 hours after the procedure, until the medicine has worn off. °PROCEDURE  °· An intravenous (IV) catheter will be inserted into one of your veins. Medicine will be able to flow directly into your body through this catheter. You may be given medicine through this tube to help prevent pain and help you relax. °· The medical or dental procedure will be done. °AFTER THE PROCEDURE °· You will stay in a recovery area until the medicine has worn off. Your blood pressure and pulse will be checked.   °·  Depending on the procedure you had, you may be allowed to go home when you can tolerate liquids and your pain is under control. °Document Released: 06/04/2001 Document Revised: 09/14/2013 Document Reviewed: 05/17/2013 °Exit[employer]® Patient Information ©2015 [employer]. This information is not intended to replace advice given to you by your health care provider. Make sure you discuss any questions you have with your health care provider. ° °

## 2014-04-29 NOTE — Progress Notes (Signed)
Pt here today for Pleurx cath insertion. Spoke with Val RN ( at Dr [employer] office). She has faxed orders and request to [facility] and they should be contacting patient.

## 2014-04-29 NOTE — H&P (Signed)
Claire Mcbride is an 53 y.o. female.   Chief Complaint: shortness of breath HPI: Patient with history of metastatic breast carcinoma with skin involvement as well as recurrent symptomatic left pleural effusion . She presents today for left pleurx catheter placement.  Past Medical History  Diagnosis Date  . Breast cancer   . Bipolar disorder   . Psoriasis   . Type 2 diabetes mellitus   . Essential hypertension, benign   . Mixed hyperlipidemia   . GERD (gastroesophageal reflux disease)   . Allergy   . Anxiety   . Depression   . Hx of migraines     chronic  . H/O sick sinus syndrome   . Hx of colonoscopy 2012    Past Surgical History  Procedure Laterality Date  . Breast surgery Bilateral   . Laparoscopy  1992    Family History  Problem Relation Age of Onset  . Breast cancer Mother    Social History:  reports that she quit smoking about 10 months ago. Her smoking use included Cigarettes. She has a 15 pack-year smoking history. She does not have any smokeless tobacco history on file. She reports that she does not drink alcohol or use illicit drugs.  Allergies:  Allergies  Allergen Reactions  . Bee Venom Anaphylaxis  . Relpax [Eletriptan] Anaphylaxis  . Bactrim [Sulfamethoxazole-Trimethoprim] Other (See Comments)    Chest pain  . Diclofenac Other (See Comments)    Chest pain  . Lidocaine Other (See Comments)    Heart beats fast  . Lisinopril Diarrhea  . Losartan Diarrhea  . Morphine And Related Other (See Comments)    Chest pain  . Other Other (See Comments)    IV electrolyte solution with perservates - turns arm red  . Penicillins Other (See Comments)    Chest pain  . Prednisone Other (See Comments)    Chest pain  . Protonix [Pantoprazole Sodium] Diarrhea  . Zantac [Ranitidine Hcl] Diarrhea    Current outpatient prescriptions:albuterol (PROVENTIL HFA;VENTOLIN HFA) 108 (90 BASE) MCG/ACT inhaler, Inhale 2 puffs into the lungs every 4 (four) hours as needed for  wheezing or shortness of breath. , Disp: , Rfl: ;  atenolol (TENORMIN) 25 MG tablet, Take 25 mg by mouth every morning. , Disp: , Rfl: ;  buPROPion (WELLBUTRIN XL) 300 MG 24 hr tablet, Take 300 mg by mouth every morning. , Disp: , Rfl:  clonazePAM (KLONOPIN) 1 MG tablet, Take 1-2 mg by mouth 2 (two) times daily. She takes one tablet in the morning and two tablets at bedtime., Disp: , Rfl: ;  docusate sodium (COLACE) 100 MG capsule, Take 100 mg by mouth daily as needed for mild constipation., Disp: , Rfl: ;  fentaNYL (DURAGESIC - DOSED MCG/HR) 100 MCG/HR, Place 100 mcg onto the skin every 3 (three) days., Disp: , Rfl:  fentaNYL (DURAGESIC - DOSED MCG/HR) 50 MCG/HR, Place 50 mcg onto the skin every 3 (three) days., Disp: , Rfl: ;  glimepiride (AMARYL) 4 MG tablet, Take 4 mg by mouth 2 (two) times daily., Disp: , Rfl: ;  HYDROmorphone (DILAUDID) 8 MG tablet, Take 8 mg by mouth every 3 (three) hours as needed (For pain.). , Disp: , Rfl: ;  ipratropium-albuterol (DUONEB) 0.5-2.5 (3) MG/3ML SOLN, Take 3 mLs by nebulization 4 (four) times daily. , Disp: , Rfl:  Krill Oil (MAXIMUM RED KRILL PO), Take 1,500 mg by mouth every morning., Disp: , Rfl: ;  lamoTRIgine (LAMICTAL) 100 MG tablet, Take 100 mg by mouth 2 (  two) times daily., Disp: , Rfl: ;  lansoprazole (PREVACID) 30 MG capsule, Take 30 mg by mouth 2 (two) times daily. , Disp: , Rfl: ;  naproxen (NAPROSYN) 250 MG tablet, Take 250 mg by mouth 2 (two) times daily as needed (For pain.)., Disp: , Rfl:  EPINEPHrine (EPIPEN) 0.3 mg/0.3 mL SOAJ injection, Inject 0.3 mg into the muscle as needed (For anaphyaxis.). , Disp: , Rfl: ;  ergocalciferol (VITAMIN D2) 50000 UNITS capsule, Take 50,000 Units by mouth every 14 (fourteen) days. , Disp: , Rfl: ;  folic acid (FOLVITE) 341 MCG tablet, Take 400 mcg by mouth daily., Disp: , Rfl:  promethazine (PHENERGAN) 25 MG tablet, Take 25 mg by mouth every 6 (six) hours as needed for nausea or vomiting., Disp: , Rfl: ;  psyllium  (REGULOID) 0.52 G capsule, Take 0.52 g by mouth every morning., Disp: , Rfl:  Current facility-administered medications:0.9 %  sodium chloride infusion, , Intravenous, Continuous, D Kevin Allred, PA-C, Last Rate: 50 mL/hr at 04/29/14 1350;  chloroprocaine (NESACAINE) 1 % (with pres) injection 20 mL, 20 mL, Other, Once, Dayne Arne Cleveland III, MD;  vancomycin (VANCOCIN) IVPB 1000 mg/200 mL premix, 1,000 mg, Intravenous, On Call, D Rowe Robert, PA-C   Results for orders placed during the hospital encounter of 04/29/14 (from the past 48 hour(s))  APTT     Status: None   Collection Time    04/29/14  1:56 PM      Result Value Ref Range   aPTT 37  24 - 37 seconds   Comment:            IF BASELINE aPTT IS ELEVATED,     SUGGEST PATIENT RISK ASSESSMENT     BE USED TO DETERMINE APPROPRIATE     ANTICOAGULANT THERAPY.  BASIC METABOLIC PANEL     Status: None   Collection Time    04/29/14  1:56 PM      Result Value Ref Range   Sodium 137  137 - 147 mEq/L   Potassium 3.9  3.7 - 5.3 mEq/L   Chloride 98  96 - 112 mEq/L   CO2 26  19 - 32 mEq/L   Glucose, Bld 83  70 - 99 mg/dL   BUN 13  6 - 23 mg/dL   Creatinine, Ser 0.51  0.50 - 1.10 mg/dL   Calcium 9.3  8.4 - 10.5 mg/dL   GFR calc non Af Amer >90  >90 mL/min   GFR calc Af Amer >90  >90 mL/min   Comment: (NOTE)     The eGFR has been calculated using the CKD EPI equation.     This calculation has not been validated in all clinical situations.     eGFR's persistently <90 mL/min signify possible Chronic Kidney     Disease.   Anion gap 13  5 - 15  CBC WITH DIFFERENTIAL     Status: Abnormal   Collection Time    04/29/14  1:56 PM      Result Value Ref Range   WBC 5.9  4.0 - 10.5 K/uL   RBC 3.68 (*) 3.87 - 5.11 MIL/uL   Hemoglobin 10.3 (*) 12.0 - 15.0 g/dL   HCT 32.5 (*) 36.0 - 46.0 %   MCV 88.3  78.0 - 100.0 fL   MCH 28.0  26.0 - 34.0 pg   MCHC 31.7  30.0 - 36.0 g/dL   RDW 17.8 (*) 11.5 - 15.5 %   Platelets 136 (*) 150 - 400 K/uL  Neutrophils Relative % 72  43 - 77 %   Neutro Abs 4.2  1.7 - 7.7 K/uL   Lymphocytes Relative 16  12 - 46 %   Lymphs Abs 1.0  0.7 - 4.0 K/uL   Monocytes Relative 10  3 - 12 %   Monocytes Absolute 0.6  0.1 - 1.0 K/uL   Eosinophils Relative 2  0 - 5 %   Eosinophils Absolute 0.1  0.0 - 0.7 K/uL   Basophils Relative 0  0 - 1 %   Basophils Absolute 0.0  0.0 - 0.1 K/uL  PROTIME-INR     Status: None   Collection Time    04/29/14  1:56 PM      Result Value Ref Range   Prothrombin Time 14.0  11.6 - 15.2 seconds   INR 1.08  0.00 - 1.49   No results found.  Review of Systems  Constitutional: Negative for fever.  Respiratory: Positive for cough and shortness of breath. Negative for hemoptysis.   Cardiovascular: Negative for chest pain.  Gastrointestinal: Positive for constipation. Negative for blood in stool.       Occ N/V, abd pain  Genitourinary: Negative for hematuria.  Musculoskeletal: Positive for back pain.  Skin: Positive for rash.  Neurological: Negative for headaches.  Endo/Heme/Allergies: Does not bruise/bleed easily.  Psychiatric/Behavioral: The patient is nervous/anxious.     Blood pressure 101/52, pulse 83, temperature 99.3 F (37.4 C), temperature source Oral, resp. rate 20, SpO2 94.00%. Physical Exam  Constitutional: She is oriented to person, place, and time. She appears well-developed and well-nourished.  Cardiovascular: Normal rate and regular rhythm.   Respiratory: Effort normal.  Dim BS left, clear on right; clean, intact rt chest wall PAC  GI: Soft. Bowel sounds are normal. There is no tenderness.  Musculoskeletal: Normal range of motion.  Neurological: She is alert and oriented to person, place, and time.  Skin:  scatt erythematous maculopapular rash ant and post chest region with overlying dry/flaky skin- most likely from breast ca involvement     Assessment/Plan Patient with history of metastatic breast carcinoma with skin involvement as well as recurrent  symptomatic left pleural effusion . She presents today for left pleurx catheter placement. Details/risks of procedure d/w pt/father with their understanding and consent.  ALLRED,D KEVIN 04/29/2014, 3:00 PM

## 2014-04-29 NOTE — Addendum Note (Signed)
Addended by: Amelia Jo I on: 04/29/2014 09:04 AM   Modules accepted: Medications

## 2014-04-29 NOTE — Procedures (Signed)
LEFT Pleurex catheter placed No complication No blood loss. See complete dictation in [facility].

## 2014-04-29 NOTE — Progress Notes (Signed)
Pt and pt's father watched the pt education video on pleurex catheters at the bedside.  Informed them the P.A. would be in soon to discuss questions/concerns about the catheter with them.  Pt and pt's father voiced understanding.

## 2014-05-02 ENCOUNTER — Other Ambulatory Visit: Payer: Self-pay | Admitting: *Deleted

## 2014-05-02 ENCOUNTER — Telehealth: Payer: Self-pay | Admitting: Oncology

## 2014-05-02 ENCOUNTER — Telehealth: Payer: Self-pay | Admitting: *Deleted

## 2014-05-02 DIAGNOSIS — C50412 Malignant neoplasm of upper-outer quadrant of left female breast: Secondary | ICD-10-CM

## 2014-05-02 NOTE — Telephone Encounter (Signed)
Received call from pt's dad @ [phone] stating that pt has a Pleurx cath placed 04/29/14 & it is beginning to leak & was told that that was a sign that it needed to be drained & is asking who the home care agency is that will do this.  Per Dr Magrinat's note this was to be set up.  [place] [phone] & spoke with Claiborne Billings.  Call back # is [phone] & referral, demographic, last OV note faxed to [phone].  Notified pt's father.

## 2014-05-02 NOTE — Telephone Encounter (Signed)
S/w pt, gave appt 8/24 @ 10.15am.

## 2014-05-04 ENCOUNTER — Ambulatory Visit (HOSPITAL_COMMUNITY): Payer: Medicare PPO

## 2014-05-04 ENCOUNTER — Telehealth: Payer: Self-pay | Admitting: *Deleted

## 2014-05-04 NOTE — Telephone Encounter (Signed)
Father called.  Dr. Jana Hakim gave them 2 written scripts on 8/6.  One for fentanyl dated 8/13 and the other hydromorphone 8mg  with no date.  Father will bring both scripts here tomorrow and we will put them in the computer and print them for a provider.   Dr. Lindi Adie has agreed to sign them.

## 2014-05-05 ENCOUNTER — Other Ambulatory Visit: Payer: Self-pay | Admitting: *Deleted

## 2014-05-05 ENCOUNTER — Telehealth: Payer: Self-pay | Admitting: *Deleted

## 2014-05-05 DIAGNOSIS — C50419 Malignant neoplasm of upper-outer quadrant of unspecified female breast: Secondary | ICD-10-CM

## 2014-05-05 MED ORDER — FENTANYL 50 MCG/HR TD PT72: 50.0000 ug | MEDICATED_PATCH | TRANSDERMAL | Status: DC

## 2014-05-05 MED ORDER — HYDROMORPHONE HCL 8 MG PO TABS: 8.0000 mg | ORAL_TABLET | ORAL | Status: DC | PRN

## 2014-05-05 MED ORDER — FENTANYL 50 MCG/HR TD PT72: 50.0000 ug | MEDICATED_PATCH | TRANSDERMAL | Status: AC

## 2014-05-05 MED ORDER — FENTANYL 100 MCG/HR TD PT72: 100.0000 ug | MEDICATED_PATCH | TRANSDERMAL | Status: AC

## 2014-05-05 MED ORDER — FENTANYL 100 MCG/HR TD PT72: 100.0000 ug | MEDICATED_PATCH | TRANSDERMAL | Status: DC

## 2014-05-05 NOTE — Telephone Encounter (Signed)
Pt's father called & states that he was able to get pt's pain med, dilaudid # 180 from her PCP & script for the duragesic 72's & 100's was written for quantity sufficient for 28 days by Dr. Jana Hakim dated for 05/05/14.  He does not need to come pick up scripts from this office today.   Will cancel scripts from this office.

## 2014-05-07 ENCOUNTER — Inpatient Hospital Stay (HOSPITAL_COMMUNITY)
Admission: EM | Admit: 2014-05-07 | Discharge: 2014-05-11 | DRG: 920 | Disposition: A | Discharge status: Home Health | Payer: Medicare PPO | Source: Other Acute Inpatient Hospital | Attending: Internal Medicine | Admitting: Internal Medicine

## 2014-05-07 ENCOUNTER — Inpatient Hospital Stay (HOSPITAL_COMMUNITY): Payer: Medicare PPO

## 2014-05-07 ENCOUNTER — Encounter (HOSPITAL_COMMUNITY): Payer: Self-pay | Admitting: Radiology

## 2014-05-07 DIAGNOSIS — L03319 Cellulitis of trunk, unspecified: Secondary | ICD-10-CM | POA: Diagnosis present

## 2014-05-07 DIAGNOSIS — L408 Other psoriasis: Secondary | ICD-10-CM | POA: Diagnosis present

## 2014-05-07 DIAGNOSIS — L03313 Cellulitis of chest wall: Secondary | ICD-10-CM

## 2014-05-07 DIAGNOSIS — T8579XA Infection and inflammatory reaction due to other internal prosthetic devices, implants and grafts, initial encounter: Secondary | ICD-10-CM | POA: Diagnosis present

## 2014-05-07 DIAGNOSIS — G893 Neoplasm related pain (acute) (chronic): Secondary | ICD-10-CM | POA: Diagnosis present

## 2014-05-07 DIAGNOSIS — C792 Secondary malignant neoplasm of skin: Secondary | ICD-10-CM | POA: Diagnosis present

## 2014-05-07 DIAGNOSIS — I471 Supraventricular tachycardia: Secondary | ICD-10-CM

## 2014-05-07 DIAGNOSIS — T8140XA Infection following a procedure, unspecified, initial encounter: Secondary | ICD-10-CM | POA: Diagnosis present

## 2014-05-07 DIAGNOSIS — L02219 Cutaneous abscess of trunk, unspecified: Secondary | ICD-10-CM | POA: Diagnosis present

## 2014-05-07 DIAGNOSIS — Z9010 Acquired absence of unspecified breast and nipple: Secondary | ICD-10-CM

## 2014-05-07 DIAGNOSIS — F319 Bipolar disorder, unspecified: Secondary | ICD-10-CM | POA: Diagnosis present

## 2014-05-07 DIAGNOSIS — Z853 Personal history of malignant neoplasm of breast: Secondary | ICD-10-CM

## 2014-05-07 DIAGNOSIS — E669 Obesity, unspecified: Secondary | ICD-10-CM | POA: Diagnosis present

## 2014-05-07 DIAGNOSIS — K5903 Drug induced constipation: Secondary | ICD-10-CM | POA: Diagnosis present

## 2014-05-07 DIAGNOSIS — Y849 Medical procedure, unspecified as the cause of abnormal reaction of the patient, or of later complication, without mention of misadventure at the time of the procedure: Secondary | ICD-10-CM | POA: Diagnosis present

## 2014-05-07 DIAGNOSIS — R0789 Other chest pain: Secondary | ICD-10-CM

## 2014-05-07 DIAGNOSIS — J910 Malignant pleural effusion: Secondary | ICD-10-CM | POA: Diagnosis present

## 2014-05-07 DIAGNOSIS — T40605A Adverse effect of unspecified narcotics, initial encounter: Secondary | ICD-10-CM | POA: Diagnosis present

## 2014-05-07 DIAGNOSIS — L409 Psoriasis, unspecified: Secondary | ICD-10-CM

## 2014-05-07 DIAGNOSIS — E119 Type 2 diabetes mellitus without complications: Secondary | ICD-10-CM | POA: Diagnosis present

## 2014-05-07 DIAGNOSIS — E782 Mixed hyperlipidemia: Secondary | ICD-10-CM

## 2014-05-07 DIAGNOSIS — Z6833 Body mass index (BMI) 33.0-33.9, adult: Secondary | ICD-10-CM | POA: Diagnosis not present

## 2014-05-07 DIAGNOSIS — I10 Essential (primary) hypertension: Secondary | ICD-10-CM | POA: Diagnosis present

## 2014-05-07 DIAGNOSIS — R002 Palpitations: Secondary | ICD-10-CM

## 2014-05-07 DIAGNOSIS — C50412 Malignant neoplasm of upper-outer quadrant of left female breast: Secondary | ICD-10-CM

## 2014-05-07 DIAGNOSIS — T402X5A Adverse effect of other opioids, initial encounter: Secondary | ICD-10-CM | POA: Diagnosis present

## 2014-05-07 DIAGNOSIS — F411 Generalized anxiety disorder: Secondary | ICD-10-CM | POA: Diagnosis present

## 2014-05-07 DIAGNOSIS — R001 Bradycardia, unspecified: Secondary | ICD-10-CM

## 2014-05-07 DIAGNOSIS — K5909 Other constipation: Secondary | ICD-10-CM | POA: Diagnosis present

## 2014-05-07 LAB — CBC WITH DIFFERENTIAL/PLATELET
BASOS ABS: 0 10*3/uL (ref 0.0–0.1)
BASOS PCT: 0 % (ref 0–1)
Eosinophils Absolute: 0.1 10*3/uL (ref 0.0–0.7)
Eosinophils Relative: 2 % (ref 0–5)
HCT: 26.7 % — ABNORMAL LOW (ref 36.0–46.0)
Hemoglobin: 8.5 g/dL — ABNORMAL LOW (ref 12.0–15.0)
LYMPHS PCT: 10 % — AB (ref 12–46)
Lymphs Abs: 0.7 10*3/uL (ref 0.7–4.0)
MCH: 27.7 pg (ref 26.0–34.0)
MCHC: 31.8 g/dL (ref 30.0–36.0)
MCV: 87 fL (ref 78.0–100.0)
Monocytes Absolute: 0.7 10*3/uL (ref 0.1–1.0)
Monocytes Relative: 9 % (ref 3–12)
NEUTROS PCT: 79 % — AB (ref 43–77)
Neutro Abs: 5.6 10*3/uL (ref 1.7–7.7)
Platelets: 141 10*3/uL — ABNORMAL LOW (ref 150–400)
RBC: 3.07 MIL/uL — AB (ref 3.87–5.11)
RDW: 17.3 % — ABNORMAL HIGH (ref 11.5–15.5)
WBC: 7.1 10*3/uL (ref 4.0–10.5)

## 2014-05-07 LAB — COMPREHENSIVE METABOLIC PANEL
ALBUMIN: 2.5 g/dL — AB (ref 3.5–5.2)
ALK PHOS: 180 U/L — AB (ref 39–117)
ALT: 14 U/L (ref 0–35)
ANION GAP: 12 (ref 5–15)
AST: 25 U/L (ref 0–37)
BUN: 9 mg/dL (ref 6–23)
CALCIUM: 8.7 mg/dL (ref 8.4–10.5)
CO2: 25 mEq/L (ref 19–32)
CREATININE: 0.45 mg/dL — AB (ref 0.50–1.10)
Chloride: 100 mEq/L (ref 96–112)
GFR calc non Af Amer: >90 mL/min (ref >90)
GLUCOSE: 107 mg/dL — AB (ref 70–99)
Potassium: 4 mEq/L (ref 3.7–5.3)
Sodium: 137 mEq/L (ref 137–147)
TOTAL PROTEIN: 5.5 g/dL — AB (ref 6.0–8.3)
Total Bilirubin: 0.5 mg/dL (ref 0.3–1.2)

## 2014-05-07 LAB — GLUCOSE, CAPILLARY
GLUCOSE-CAPILLARY: 78 mg/dL (ref 70–99)
Glucose-Capillary: 77 mg/dL (ref 70–99)
Glucose-Capillary: 103 mg/dL — ABNORMAL HIGH (ref 70–99)
Glucose-Capillary: 89 mg/dL (ref 70–99)
Glucose-Capillary: 109 mg/dL — ABNORMAL HIGH (ref 70–99)

## 2014-05-07 LAB — BODY FLUID CELL COUNT WITH DIFFERENTIAL
EOS FL: 4 %
LYMPHS FL: 12 %
MONOCYTE-MACROPHAGE-SEROUS FLUID: 4 % — AB (ref 50–90)
Neutrophil Count, Fluid: 80 % — ABNORMAL HIGH (ref 0–25)
WBC FLUID: 666 uL (ref 0–1000)

## 2014-05-07 LAB — APTT: aPTT: 36 seconds (ref 24–37)

## 2014-05-07 LAB — PROTIME-INR
INR: 1.12 (ref 0.00–1.49)
Prothrombin Time: 14.4 seconds (ref 11.6–15.2)

## 2014-05-07 LAB — STREP PNEUMONIAE URINARY ANTIGEN: Strep Pneumo Urinary Antigen: NEGATIVE

## 2014-05-07 MED ORDER — IPRATROPIUM-ALBUTEROL 0.5-2.5 (3) MG/3ML IN SOLN
3.0000 mL | Freq: Four times a day (QID) | RESPIRATORY_TRACT | Status: DC
  Administered 2014-05-07 (×2): 3 mL via RESPIRATORY_TRACT
  Filled 2014-05-07 (×3): qty 3

## 2014-05-07 MED ORDER — PROMETHAZINE HCL 25 MG PO TABS: 25.0000 mg | ORAL_TABLET | Freq: Four times a day (QID) | ORAL | Status: DC | PRN

## 2014-05-07 MED ORDER — DOCUSATE SODIUM 100 MG PO CAPS: 100.0000 mg | ORAL_CAPSULE | Freq: Every day | ORAL | Status: DC | PRN

## 2014-05-07 MED ORDER — ALBUTEROL SULFATE (2.5 MG/3ML) 0.083% IN NEBU: 3.0000 mL | INHALATION_SOLUTION | RESPIRATORY_TRACT | Status: DC | PRN

## 2014-05-07 MED ORDER — VANCOMYCIN HCL 10 G IV SOLR
1250.0000 mg | Freq: Two times a day (BID) | INTRAVENOUS | Status: DC
  Administered 2014-05-07 – 2014-05-10 (×8): 1250 mg via INTRAVENOUS
  Filled 2014-05-07 (×12): qty 1250

## 2014-05-07 MED ORDER — INSULIN ASPART 100 UNIT/ML ~~LOC~~ SOLN: 0.0000 [IU] | Freq: Every day | SUBCUTANEOUS | Status: DC

## 2014-05-07 MED ORDER — IOHEXOL 300 MG/ML  SOLN
80.0000 mL | Freq: Once | INTRAMUSCULAR | Status: AC | PRN
  Administered 2014-05-07: 80 mL via INTRAVENOUS

## 2014-05-07 MED ORDER — FLEET ENEMA 7-19 GM/118ML RE ENEM
1.0000 | ENEMA | Freq: Once | RECTAL | Status: DC
  Filled 2014-05-07: qty 1

## 2014-05-07 MED ORDER — BUPROPION HCL ER (XL) 300 MG PO TB24
300.0000 mg | ORAL_TABLET | Freq: Every morning | ORAL | Status: DC
  Administered 2014-05-07 – 2014-05-10 (×4): 300 mg via ORAL
  Filled 2014-05-07 (×5): qty 1

## 2014-05-07 MED ORDER — HEPARIN SODIUM (PORCINE) 5000 UNIT/ML IJ SOLN
5000.0000 [IU] | Freq: Three times a day (TID) | INTRAMUSCULAR | Status: DC
  Administered 2014-05-07 – 2014-05-11 (×12): 5000 [IU] via SUBCUTANEOUS
  Filled 2014-05-07 (×16): qty 1

## 2014-05-07 MED ORDER — LAMOTRIGINE 100 MG PO TABS
100.0000 mg | ORAL_TABLET | Freq: Two times a day (BID) | ORAL | Status: DC
  Administered 2014-05-07 – 2014-05-10 (×8): 100 mg via ORAL
  Filled 2014-05-07 (×11): qty 1

## 2014-05-07 MED ORDER — LORAZEPAM 2 MG/ML IJ SOLN
2.0000 mg | Freq: Once | INTRAMUSCULAR | Status: AC
  Administered 2014-05-07: 2 mg via INTRAVENOUS
  Filled 2014-05-07: qty 1

## 2014-05-07 MED ORDER — CLONAZEPAM 1 MG PO TABS
1.0000 mg | ORAL_TABLET | Freq: Every day | ORAL | Status: DC
  Administered 2014-05-07 – 2014-05-09 (×3): 1 mg via ORAL
  Filled 2014-05-07 (×3): qty 1

## 2014-05-07 MED ORDER — DOCUSATE SODIUM 100 MG PO CAPS
200.0000 mg | ORAL_CAPSULE | Freq: Two times a day (BID) | ORAL | Status: DC
  Administered 2014-05-07 – 2014-05-10 (×7): 200 mg via ORAL
  Filled 2014-05-07 (×6): qty 2

## 2014-05-07 MED ORDER — HYDROMORPHONE HCL 2 MG PO TABS
8.0000 mg | ORAL_TABLET | Freq: Three times a day (TID) | ORAL | Status: DC
  Administered 2014-05-07 (×2): 8 mg via ORAL
  Administered 2014-05-08: 4 mg via ORAL
  Administered 2014-05-08 – 2014-05-09 (×3): 8 mg via ORAL
  Filled 2014-05-07 (×7): qty 4

## 2014-05-07 MED ORDER — INSULIN ASPART 100 UNIT/ML ~~LOC~~ SOLN
0.0000 [IU] | Freq: Three times a day (TID) | SUBCUTANEOUS | Status: DC
  Administered 2014-05-09 (×2): 1 [IU] via SUBCUTANEOUS

## 2014-05-07 MED ORDER — DEXTROSE 5 % IV SOLN
2.0000 g | Freq: Three times a day (TID) | INTRAVENOUS | Status: DC
  Administered 2014-05-07 – 2014-05-11 (×13): 2 g via INTRAVENOUS
  Filled 2014-05-07 (×16): qty 2

## 2014-05-07 MED ORDER — CLONAZEPAM 1 MG PO TABS
2.0000 mg | ORAL_TABLET | Freq: Every day | ORAL | Status: DC
  Administered 2014-05-07 – 2014-05-08 (×2): 2 mg via ORAL
  Filled 2014-05-07 (×2): qty 2

## 2014-05-07 MED ORDER — ENSURE COMPLETE PO LIQD: 237.0000 mL | Freq: Two times a day (BID) | ORAL | Status: DC

## 2014-05-07 MED ORDER — HYDROMORPHONE HCL PF 1 MG/ML IJ SOLN
0.5000 mg | INTRAMUSCULAR | Status: DC | PRN
  Administered 2014-05-07: 0.5 mg via INTRAVENOUS
  Filled 2014-05-07: qty 1

## 2014-05-07 MED ORDER — PANTOPRAZOLE SODIUM 20 MG PO TBEC: 20.0000 mg | DELAYED_RELEASE_TABLET | Freq: Every day | ORAL | Status: DC

## 2014-05-07 MED ORDER — FENTANYL 50 MCG/HR TD PT72
50.0000 ug | MEDICATED_PATCH | TRANSDERMAL | Status: DC
  Administered 2014-05-07 – 2014-05-10 (×2): 50 ug via TRANSDERMAL
  Filled 2014-05-07 (×2): qty 1

## 2014-05-07 MED ORDER — ATENOLOL 25 MG PO TABS
25.0000 mg | ORAL_TABLET | Freq: Every morning | ORAL | Status: DC
  Administered 2014-05-07 – 2014-05-10 (×4): 25 mg via ORAL
  Filled 2014-05-07 (×6): qty 1

## 2014-05-07 MED ORDER — IPRATROPIUM-ALBUTEROL 0.5-2.5 (3) MG/3ML IN SOLN
3.0000 mL | Freq: Four times a day (QID) | RESPIRATORY_TRACT | Status: DC | PRN
  Administered 2014-05-08 – 2014-05-09 (×3): 3 mL via RESPIRATORY_TRACT
  Filled 2014-05-07 (×3): qty 3

## 2014-05-07 MED ORDER — FENTANYL 100 MCG/HR TD PT72
100.0000 ug | MEDICATED_PATCH | TRANSDERMAL | Status: DC
  Administered 2014-05-07 – 2014-05-10 (×2): 100 ug via TRANSDERMAL
  Filled 2014-05-07 (×2): qty 1

## 2014-05-07 MED ORDER — SODIUM CHLORIDE 0.9 % IJ SOLN
10.0000 mL | INTRAMUSCULAR | Status: DC | PRN
  Administered 2014-05-07 – 2014-05-11 (×4): 10 mL

## 2014-05-07 NOTE — Consult Note (Signed)
Reason for Consult:Possible PleurX catheter site infection. Consulting Radiologist: Hoss Referring Physician: [place] Pink   HPI: Claire Mcbride is an 53 y.o. female with metastatic breast carcinoma with significant skin involvement and recurrent left pleural effusion. She underwent (L)PleurX catheter placement last week. She has been having it drained at home by [facility] who has been removing 1L of bloody fluid each time. Her last drainage was Thurs 8/13. She has now been admitted with c/o purulent drainage from catheter site. IR requested to eval drain.  Past Medical History:  Past Medical History  Diagnosis Date  . Breast cancer   . Bipolar disorder   . Psoriasis   . Type 2 diabetes mellitus   . Essential hypertension, benign   . Mixed hyperlipidemia   . GERD (gastroesophageal reflux disease)   . Allergy   . Anxiety   . Depression   . Hx of migraines     chronic  . H/O sick sinus syndrome   . Hx of colonoscopy 2012    Surgical History:  Past Surgical History  Procedure Laterality Date  . Breast surgery Bilateral   . Laparoscopy  1992    Family History:  Family History  Problem Relation Age of Onset  . Breast cancer Mother     Social History:  reports that she quit smoking about 10 months ago. Her smoking use included Cigarettes. She has a 15 pack-year smoking history. She does not have any smokeless tobacco history on file. She reports that she does not drink alcohol or use illicit drugs.  Allergies:  Allergies  Allergen Reactions  . Bee Venom Anaphylaxis  . Relpax [Eletriptan] Anaphylaxis  . Bactrim [Sulfamethoxazole-Trimethoprim] Other (See Comments)    Chest pain  . Diclofenac Other (See Comments)    Chest pain  . Levofloxacin     Chest pain  . Lidocaine Other (See Comments)    Heart beats fast  . Lisinopril Diarrhea  . Losartan Diarrhea  . Morphine And Related Other (See Comments)    Chest pain (Patient takes po hydromorphone at home.)  . Other Other (See  Comments)    IV electrolyte solution with perservates - turns arm red  . Penicillins Other (See Comments)    Chest pain  . Prednisone Other (See Comments)    Chest pain  . Protonix [Pantoprazole Sodium] Diarrhea  . Zantac [Ranitidine Hcl] Diarrhea    Medications: Current facility-administered medications:albuterol (PROVENTIL) (2.5 MG/3ML) 0.083% nebulizer solution 3 mL, 3 mL, Inhalation, Q4H PRN, Berle Mull, MD;  atenolol (TENORMIN) tablet 25 mg, 25 mg, Oral, q morning - 10a, Berle Mull, MD, 25 mg at 05/07/14 1038;  aztreonam (AZACTAM) 2 g in dextrose 5 % 50 mL IVPB, 2 g, Intravenous, 3 times per day, Berle Mull, MD, 2 g at 05/07/14 0520 buPROPion (WELLBUTRIN XL) 24 hr tablet 300 mg, 300 mg, Oral, q morning - 10a, Berle Mull, MD, 300 mg at 05/07/14 1038;  clonazePAM (KLONOPIN) tablet 1 mg, 1 mg, Oral, Daily, Berle Mull, MD, 1 mg at 05/07/14 1052;  clonazePAM (KLONOPIN) tablet 2 mg, 2 mg, Oral, QHS, Berle Mull, MD;  docusate sodium (COLACE) capsule 200 mg, 200 mg, Oral, BID, Annita Brod, MD, 200 mg at 05/07/14 1058 fentaNYL (DURAGESIC - dosed mcg/hr) 100 mcg, 100 mcg, Transdermal, Q72H, Berle Mull, MD, 100 mcg at 05/07/14 1048;  fentaNYL (DURAGESIC - dosed mcg/hr) 50 mcg, 50 mcg, Transdermal, Q72H, Berle Mull, MD, 50 mcg at 05/07/14 1046;  heparin injection 5,000 Units, 5,000 Units, Subcutaneous, 3 times  per day, Berle Mull, MD, 5,000 Units at 05/07/14 0520 HYDROmorphone (DILAUDID) tablet 8 mg, 8 mg, Oral, TID, Annita Brod, MD, 8 mg at 05/07/14 1131;  insulin aspart (novoLOG) injection 0-5 Units, 0-5 Units, Subcutaneous, QHS, Berle Mull, MD;  insulin aspart (novoLOG) injection 0-9 Units, 0-9 Units, Subcutaneous, TID WC, Berle Mull, MD;  ipratropium-albuterol (DUONEB) 0.5-2.5 (3) MG/3ML nebulizer solution 3 mL, 3 mL, Nebulization, QID, Berle Mull, MD, 3 mL at 05/07/14 0807 lamoTRIgine (LAMICTAL) tablet 100 mg, 100 mg, Oral, BID, Berle Mull, MD, 100 mg at 05/07/14  1039;  promethazine (PHENERGAN) tablet 25 mg, 25 mg, Oral, Q6H PRN, Berle Mull, MD;  sodium chloride 0.9 % injection 10-40 mL, 10-40 mL, Intracatheter, PRN, Berle Mull, MD, 10 mL at 05/07/14 0458;  sodium phosphate (FLEET) 7-19 GM/118ML enema 1 enema, 1 enema, Rectal, Once, Annita Brod, MD vancomycin (VANCOCIN) 1,250 mg in sodium chloride 0.9 % 250 mL IVPB, 1,250 mg, Intravenous, Q12H, Lauren Bajbus, RPH   ROS: See HPI for pertinent findings, otherwise complete 10 system review negative.  Physical Exam: Blood pressure 137/59, pulse 102, temperature 98.5 F (36.9 C), temperature source Oral, height 5' 5"  (1.651 m), weight 199 lb 4.7 oz (90.4 kg), SpO2 95.00%. Skin: significant skin changes across chest with open weeping wound at subxyphoid region. (L)PleurX catheter site is erythematous and indurated. Tenderness but no fluctuance along tract. Some dry crusting at site as well. Catheter is hooked to vaccutainer and drained ~1L of bloody fluid, difficult to assess for gross purulence in fluid.  Pt experienced mild discomfort in left chest during drainage.   Labs: CBC  Recent Labs  05/07/14 0455  WBC 7.1  HGB 8.5*  HCT 26.7*  PLT 141*   MET  Recent Labs  05/07/14 0455  NA 137  K 4.0  CL 100  CO2 25  GLUCOSE 107*  BUN 9  CREATININE 0.45*  CALCIUM 8.7    Recent Labs  05/07/14 0455  PROT 5.5*  ALBUMIN 2.5*  AST 25  ALT 14  ALKPHOS 180*  BILITOT 0.5   PT/INR  Recent Labs  05/07/14 0455  LABPROT 14.4  INR 1.12    Ct Chest W Contrast  05/07/2014   CLINICAL DATA:  Infection in region of a left chest catheter.  EXAM: CT CHEST WITH CONTRAST  TECHNIQUE: Multidetector CT imaging of the chest was performed during intravenous contrast administration.  CONTRAST:  29m OMNIPAQUE IOHEXOL 300 MG/ML  SOLN  COMPARISON:  Chest radiograph, 07/06/2013.  FINDINGS: There is a Port-A-Cath with its tip just above the caval atrial junction. There is no associated inflammation  or fluid collection.  Along the left chest wall, bordering the pectoralis major, there is a fluid collection measuring 6 cm x 2.6 cm x 5.5 cm there is adjacent inflammatory stranding. Overlying skin is thickened. Patient has changes consistent with a left mastectomy. This collection could reflect a postoperative seroma.  A left chest tube enters the lateral inferior left hemi thorax extending along the posterior aspect of the thoracic cavity to have its tip near the left apex. There is a large left pleural effusion. The left lower lobe is collapsed. There are there is partial atelectasis of the left upper lobe.  There is fluid attenuation in the subcutaneous soft tissues of the lateral inferior hemi thorax and upper abdomen, adjacent to the chest tube insertion site on the left. No defined collection is seen. This is incompletely imaged. It is likely edema.  There is no evidence of  pulmonary edema. There are several areas of coarse reticular type opacity in the upper lobes. Mild dependent subsegmental atelectasis is noted in the right lower lobe. No discrete pulmonary mass or nodule is seen.  Heart is normal in size. No mediastinal or hilar masses. A mildly enlarged right precarinal lymph node is noted measuring 12 mm in short axis.  Below the diaphragm there is no significant abnormality.  No osteoblastic or osteolytic lesions.  IMPRESSION: 1. Fluid collection along the lateral margin of the left pectoralis muscle. This could reflect an abscess. It may reflect a postsurgical seroma. 2. No evidence of a fluid collection or inflammation associated with the right anterior chest wall Port-A-Cath. 3. Large left pleural effusion. Left lower lobe atelectasis and partial left upper lobe atelectasis. This is despite the presence of a well-positioned left-sided chest tube. 4. No pulmonary edema. No convincing metastatic disease to the lungs. A central obstructing lesion involving the left lower lobe bronchi should be  considered in the proper clinical setting and may warrant bronchoscopy.   Electronically Signed   By: Lajean Manes M.D.   On: 05/07/2014 07:48    Assessment/Plan: Cellulitis at (L)pleurX catheter site, no obvious abscess, appearance on CT was more edematous than organized fluid collection. Will send fluid for lab analysis and cont IV abx. This may require PleurX catheter removal and placement of pigtail chest tube for decompression of recurrent effusion. IR team following.  Ascencion Dike PA-C 05/07/2014, 12:29 PM

## 2014-05-07 NOTE — Progress Notes (Signed)
ANTIBIOTIC CONSULT NOTE - INITIAL  Pharmacy Consult for vancomycin  Indication: wound infection  Allergies  Allergen Reactions  . Bee Venom Anaphylaxis  . Relpax [Eletriptan] Anaphylaxis  . Bactrim [Sulfamethoxazole-Trimethoprim] Other (See Comments)    Chest pain  . Diclofenac Other (See Comments)    Chest pain  . Levofloxacin     Chest pain  . Lidocaine Other (See Comments)    Heart beats fast  . Lisinopril Diarrhea  . Losartan Diarrhea  . Morphine And Related Other (See Comments)    Chest pain (Patient takes po hydromorphone at home.)  . Other Other (See Comments)    IV electrolyte solution with perservates - turns arm red  . Penicillins Other (See Comments)    Chest pain  . Prednisone Other (See Comments)    Chest pain  . Protonix [Pantoprazole Sodium] Diarrhea  . Zantac [Ranitidine Hcl] Diarrhea    Patient Measurements: Height: 5\' 5"  (165.1 cm) Weight: 200 lb 2.8 oz (90.8 kg) IBW/kg (Calculated) : 57 Adjusted Body Weight:   Vital Signs: Temp: 97.8 F (36.6 C) (08/15 0103) Temp src: Oral (08/15 0103) BP: 131/77 mmHg (08/15 0103) Pulse Rate: 92 (08/15 0103) Intake/Output from previous day: 08/14 0701 - 08/15 0700 In: -  Out: 400 [Urine:400] Intake/Output from this shift: Total I/O In: -  Out: 400 [Urine:400]  Labs: No results found for this basename: WBC, HGB, PLT, LABCREA, CREATININE,  in the last 72 hours Estimated Creatinine Clearance: 91.6 ml/min (by C-G formula based on Cr of 0.51). No results found for this basename: VANCOTROUGH, VANCOPEAK, VANCORANDOM, GENTTROUGH, GENTPEAK, GENTRANDOM, TOBRATROUGH, TOBRAPEAK, TOBRARND, AMIKACINPEAK, AMIKACINTROU, AMIKACIN,  in the last 72 hours   Microbiology: No results found for this or any previous visit (from the past 720 hour(s)).  Medical History: Past Medical History  Diagnosis Date  . Breast cancer   . Bipolar disorder   . Psoriasis   . Type 2 diabetes mellitus   . Essential hypertension, benign    . Mixed hyperlipidemia   . GERD (gastroesophageal reflux disease)   . Allergy   . Anxiety   . Depression   . Hx of migraines     chronic  . H/O sick sinus syndrome   . Hx of colonoscopy 2012    Medications:  Prescriptions prior to admission  Medication Sig Dispense Refill  . albuterol (PROVENTIL HFA;VENTOLIN HFA) 108 (90 BASE) MCG/ACT inhaler Inhale 2 puffs into the lungs every 4 (four) hours as needed for wheezing or shortness of breath.       Marland Kitchen atenolol (TENORMIN) 25 MG tablet Take 25 mg by mouth every morning.       Marland Kitchen buPROPion (WELLBUTRIN XL) 300 MG 24 hr tablet Take 300 mg by mouth every morning.       . clonazePAM (KLONOPIN) 1 MG tablet Take 1-2 mg by mouth 2 (two) times daily. She takes one tablet in the morning and two tablets at bedtime.      . docusate sodium (COLACE) 100 MG capsule Take 100 mg by mouth daily as needed for mild constipation.      Marland Kitchen EPINEPHrine (EPIPEN) 0.3 mg/0.3 mL SOAJ injection Inject 0.3 mg into the muscle as needed (For anaphyaxis.).       Marland Kitchen ergocalciferol (VITAMIN D2) 50000 UNITS capsule Take 50,000 Units by mouth every 14 (fourteen) days.       . fentaNYL (DURAGESIC - DOSED MCG/HR) 100 MCG/HR Place 1 patch (100 mcg total) onto the skin every 3 (three) days.  10  patch  0  . fentaNYL (DURAGESIC - DOSED MCG/HR) 50 MCG/HR Place 1 patch (50 mcg total) onto the skin every 3 (three) days.  10 patch  0  . folic acid (FOLVITE) 161 MCG tablet Take 400 mcg by mouth daily.      Marland Kitchen glimepiride (AMARYL) 4 MG tablet Take 4 mg by mouth 2 (two) times daily.      Marland Kitchen ipratropium-albuterol (DUONEB) 0.5-2.5 (3) MG/3ML SOLN Take 3 mLs by nebulization 4 (four) times daily.       Javier Docker Oil (MAXIMUM RED KRILL PO) Take 1,500 mg by mouth every morning.      . lamoTRIgine (LAMICTAL) 100 MG tablet Take 100 mg by mouth 2 (two) times daily.      . lansoprazole (PREVACID) 30 MG capsule Take 30 mg by mouth 2 (two) times daily.       . naproxen (NAPROSYN) 250 MG tablet Take 250 mg by  mouth 2 (two) times daily as needed (For pain.).      Marland Kitchen promethazine (PHENERGAN) 25 MG tablet Take 25 mg by mouth every 6 (six) hours as needed for nausea or vomiting.      . psyllium (REGULOID) 0.52 G capsule Take 0.52 g by mouth every morning.       Assessment: Vancomycin for wound infection. TRH note not written.   Goal of Therapy:  Vancomycin trough level 10-15 mcg/ml  Plan:   Vancomycin 1250mg  q12h  Trough before 4th dose.    Curlene Dolphin 05/07/2014,2:42 AM

## 2014-05-07 NOTE — Progress Notes (Signed)
   Follow Up Note  Pt admitted earlier this morning as transfer from outside hospital.  Seen after arrived to floor.  She is quite anxious and tearful, stressed about situation. Hungry. Complains of constipation and chronic pain  Exam: CV: Regular rate and rhythm, borderline tachycardia Lungs: Decreased breath sounds bibasilar Abd: Soft, mild distention, hypoactive bowel sounds, generalized nonspecific tenderness Ext: No clubbing or cyanosis or edema  Present on Admission:  . Pleural drain infection: Appreciate interventional radiology help. We'll initially attempt vacuum bottle during and followup to see how much residual fluid remaining. Continue IV antibiotics  . Breast cancer of upper-outer quadrant of left female breast:  . Bipolar 1 disorder: Stable. Continue home medications.  . Obesity (BMI 30-39.9): Patient needs criteria with BMI greater than 30  . Generalized anxiety disorder continue home meds, +1 acute dose of IV Ativan  Constipation: Have started her on bowel regimen. Colace plus we'll try Fleet enema  Additional time spent: 25 minutes

## 2014-05-07 NOTE — H&P (Signed)
[facility] History and Physical  Patient: Claire Mcbride  HYQ:[id number]  DOB: 05/04/1961  DOS: the patient was seen and examined on 05/07/2014 PCP: Joseph Art, MD  Chief Complaint: Pus from the pleural drain  HPI: Claire Mcbride is a 53 y.o. 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 The patient presents complaints of Pus coming out from her pleural drain. Patient has history of breast cancer status post mastectomy and chemoradiation. She was initially following up at [facility] and now following up at [facility] long. Please review of detailed note by Dr. Jana Hakim for her prior cancer care and current plan of care. Patient recently presented with complaints of shortness of breath and was found to have malignant pleural effusion with positive cytology. She had undergone thoracentesis once and 1 04/28/2014 has undergone Pleurx catheter placement. She mentions the catheter was draining a regular for her on to yesterday on 05/05/2014 when she started noticing some yellowish pus coming out of it. She also noticed some pus coming out of from her prior thoracentesis site. She has noticed some redness at the surrounding area. She has redness from her cancer treatment as well as cancer metastasis on her skin which has started developing some yellowish scab. She denies any fever or chills denies any nausea or vomiting. She complains of constipation. She denies any abdominal pain. She denies any focal deficit. She denies any recent change in her medication. With this the patient initially presented to [facility], from where the patient was transferred to [facility] for further care. She received IV fluids there as well as IV Aztreonam, probable another antibiotic prior to her transfer.  The patient is coming from home. And at her  baseline independent for most of her ADL.  Review of Systems: as mentioned in the history of present illness.  A Comprehensive review of the other systems is negative.  Past Medical History  Diagnosis Date  . Breast cancer   . Bipolar disorder   . Psoriasis   . Type 2 diabetes mellitus   . Essential hypertension, benign   . Mixed hyperlipidemia   . GERD (gastroesophageal reflux disease)   . Allergy   . Anxiety   . Depression   . Hx of migraines     chronic  . H/O sick sinus syndrome   . Hx of colonoscopy 2012   Past Surgical History  Procedure Laterality Date  . Breast surgery Bilateral   . Laparoscopy  1992   Social History:  reports that she quit smoking about 10 months ago. Her smoking use included Cigarettes. She has a 15 pack-year smoking history. She does not have any smokeless tobacco history on file. She reports that she does not drink alcohol or use illicit drugs.  Allergies  Allergen Reactions  . Bee Venom Anaphylaxis  . Relpax [Eletriptan] Anaphylaxis  . Bactrim [Sulfamethoxazole-Trimethoprim] Other (See Comments)    Chest pain  . Diclofenac Other (See Comments)    Chest pain  . Levofloxacin     Chest pain  . Lidocaine Other (See Comments)    Heart beats fast  . Lisinopril Diarrhea  . Losartan Diarrhea  . Morphine And Related Other (See Comments)    Chest pain (Patient takes po hydromorphone at home.)  . Other Other (See Comments)    IV electrolyte solution with perservates - turns arm red  . Penicillins Other (See Comments)  Chest pain  . Prednisone Other (See Comments)    Chest pain  . Protonix [Pantoprazole Sodium] Diarrhea  . Zantac [Ranitidine Hcl] Diarrhea    Family History  Problem Relation Age of Onset  . Breast cancer Mother     Prior to Admission medications   Medication Sig Start Date End Date Taking? Authorizing Provider  albuterol (PROVENTIL HFA;VENTOLIN HFA) 108 (90 BASE) MCG/ACT inhaler Inhale 2 puffs into the lungs every 4  (four) hours as needed for wheezing or shortness of breath.     Historical Provider, MD  atenolol (TENORMIN) 25 MG tablet Take 25 mg by mouth every morning.  07/29/13   Satira Sark, MD  buPROPion (WELLBUTRIN XL) 300 MG 24 hr tablet Take 300 mg by mouth every morning.     Historical Provider, MD  clonazePAM (KLONOPIN) 1 MG tablet Take 1-2 mg by mouth 2 (two) times daily. She takes one tablet in the morning and two tablets at bedtime.    Historical Provider, MD  docusate sodium (COLACE) 100 MG capsule Take 100 mg by mouth daily as needed for mild constipation.    Historical Provider, MD  EPINEPHrine (EPIPEN) 0.3 mg/0.3 mL SOAJ injection Inject 0.3 mg into the muscle as needed (For anaphyaxis.).     Historical Provider, MD  ergocalciferol (VITAMIN D2) 50000 UNITS capsule Take 50,000 Units by mouth every 14 (fourteen) days.     Historical Provider, MD  fentaNYL (DURAGESIC - DOSED MCG/HR) 100 MCG/HR Place 1 patch (100 mcg total) onto the skin every 3 (three) days. 05/05/14   Chauncey Cruel, MD  fentaNYL (DURAGESIC - DOSED MCG/HR) 50 MCG/HR Place 1 patch (50 mcg total) onto the skin every 3 (three) days. 05/05/14   Chauncey Cruel, MD  folic acid (FOLVITE) 710 MCG tablet Take 400 mcg by mouth daily.    Historical Provider, MD  glimepiride (AMARYL) 4 MG tablet Take 4 mg by mouth 2 (two) times daily.    Historical Provider, MD  ipratropium-albuterol (DUONEB) 0.5-2.5 (3) MG/3ML SOLN Take 3 mLs by nebulization 4 (four) times daily.     Historical Provider, MD  Javier Docker Oil (MAXIMUM RED KRILL PO) Take 1,500 mg by mouth every morning.    Historical Provider, MD  lamoTRIgine (LAMICTAL) 100 MG tablet Take 100 mg by mouth 2 (two) times daily.    Historical Provider, MD  lansoprazole (PREVACID) 30 MG capsule Take 30 mg by mouth 2 (two) times daily.     Historical Provider, MD  naproxen (NAPROSYN) 250 MG tablet Take 250 mg by mouth 2 (two) times daily as needed (For pain.).    Historical Provider, MD   promethazine (PHENERGAN) 25 MG tablet Take 25 mg by mouth every 6 (six) hours as needed for nausea or vomiting.    Historical Provider, MD  psyllium (REGULOID) 0.52 G capsule Take 0.52 g by mouth every morning.    Historical Provider, MD    Physical Exam: Filed Vitals:   05/07/14 0103 05/07/14 0515  BP: 131/77 137/59  Pulse: 92 102  Temp: 97.8 F (36.6 C) 98.5 F (36.9 C)  TempSrc: Oral Oral  Height: 5\' 5"  (1.651 m)   Weight: 90.8 kg (200 lb 2.8 oz) 90.4 kg (199 lb 4.7 oz)  SpO2: 92% 95%    General: Alert, Awake and Oriented to Time, Place and Person. Appear in mild distress Eyes: PERRL ENT: Oral Mucosa clear moist. Neck: no JVD Cardiovascular: S1 and S2 Present, no Murmur, Peripheral Pulses Present Respiratory: Bilateral Air entry  equal and Decreased, faint on the left Crackles, no wheezes Pleurx catheter site has mild redness, no pus coming out, yellowish gestation noted over the skin of the surrounding area and yellowish gestation noted that thoracentesis site.  Abdomen: Bowel Sound Present, Soft and Non tender Skin: No significant Rash other than her prior chemotherapy rash Extremities: Bilateral Pedal edema, no calf tenderness Neurologic: Grossly no focal neuro deficit.  Labs on Admission:  CBC:  Recent Labs Lab 05/07/14 0455  WBC 7.1  NEUTROABS 5.6  HGB 8.5*  HCT 26.7*  MCV 87.0  PLT 141*    CMP     Component Value Date/Time   NA 137 05/07/2014 0455   K 4.0 05/07/2014 0455   CL 100 05/07/2014 0455   CO2 25 05/07/2014 0455   GLUCOSE 107* 05/07/2014 0455   BUN 9 05/07/2014 0455   CREATININE 0.45* 05/07/2014 0455   CALCIUM 8.7 05/07/2014 0455   PROT 5.5* 05/07/2014 0455   ALBUMIN 2.5* 05/07/2014 0455   AST 25 05/07/2014 0455   ALT 14 05/07/2014 0455   ALKPHOS 180* 05/07/2014 0455   BILITOT 0.5 05/07/2014 0455   GFRNONAA >90 05/07/2014 0455   GFRAA >90 05/07/2014 0455   Troponin proBNP lactic acid ABG from the other facility review, no significant abnormality  found.  Radiological Exams on Admission: Chest x-ray from the other is a facility reviewed Improvement in left pleural effusion.  EKG: Independently reviewed from the other facility. normal sinus rhythm, nonspecific ST and T waves changes. Assessment/Plan Principal Problem:   Pleural drain infection Active Problems:   Breast cancer of upper-outer quadrant of left female breast   Bipolar 1 disorder   1. Pleural drain infection The patient is presenting with complaints of pus coming out of work of her brain catheter. A chest x-ray has been reported as that has been decreasing effusion on the left side. At present I do not have any reports of the pleural catheter Gram stain performed at the other facility but they did perform culture as well as Gram stain and blood cultures prior to giving the patient antibiotics. We will await for culture from that facility. Were discussed the case with INR in morning who has performed the procedure. Rocco that I will obtain a CT chest with contrast for further workup related to her effusion as well as catheter placement. I would cover her broadly cover MRSA as well as anaerobic an organism with vancomycin and aztreonam. Patient will be kept n.p.o.  2. Bipolar disorder Anxiety Continue with Klonopin at home dose as well as place continue with lamotrigine and Wellbutrin.  3. Hypertension. Blood pressure at present stable. Continue with her home medication.  4. Diabetes. Holding Amaryl and placing her on insulin sliding scale.  5. Pain management. Continue patient's home pain management with fentanyl patch as well as Dilaudid for breakthrough. Bowel regimen.  Consults: Intervention radiology  DVT Prophylaxis: subcutaneous Heparin nutrition: N.p.o.  Code Status: Full  Family Communication: Family was present at bedside, opportunity was given to ask question and all questions were answered satisfactorily at the time of interview. Disposition:  Admitted to inpatient in med-surge unit.  Author: Berle Mull, MD [facility] Pager: [phone]  If 7PM-7AM, please contact night-coverage [email or web address] Password [email or web address]  **Disclaimer: This note may have been dictated with voice recognition software. Similar sounding words can inadvertently be transcribed and this note may contain transcription errors which may not have been corrected upon publication of note.**

## 2014-05-07 NOTE — Progress Notes (Addendum)
INITIAL NUTRITION ASSESSMENT  DOCUMENTATION CODES Per approved criteria  -Obesity Unspecified   INTERVENTION: - Once diet upgraded, add Ensure Complete po BID, each supplement provides 350 kcal and 13 grams of protein - Upgrade diet as medically tolerated per MD discretion.  NUTRITION DIAGNOSIS: Inadequate oral intake related to inability to eat as evidenced by NPO.   Goal: Pt to meet >/= 90% of their estimated nutrition needs   Monitor:  Weight trends, po intake, acceptance of supplements, labs  Reason for Assessment: MST  53 y.o. female  Admitting Dx: Pleural drain infection  ASSESSMENT: 53 y.o. 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  The patient presents complaints of Pus coming out from her pleural drain. Patient has history of breast cancer status post mastectomy and chemoradiation.   - Pt reports that she has not been eating well and has lost ~21 lbs in over a year.  - She says that her appetite varies, and sometimes she doesn't feel like eating much. - Currently pt is very hungry and would like to have solid food.  - Pt drinks Ensure supplements at home. - No signs of fat or muscle wasting at this time.  Height: Ht Readings from Last 1 Encounters:  05/07/14 5\' 5"  (1.651 m)    Weight: Wt Readings from Last 1 Encounters:  05/07/14 199 lb 4.7 oz (90.4 kg)    Ideal Body Weight: 73 kg  % Ideal Body Weight: 124%  Wt Readings from Last 10 Encounters:  05/07/14 199 lb 4.7 oz (90.4 kg)  04/28/14 198 lb 14.4 oz (90.22 kg)  01/31/14 213 lb (96.616 kg)  12/21/13 215 lb 3.2 oz (97.614 kg)  07/29/13 202 lb 12.8 oz (91.989 kg)  07/09/13 198 lb 1.9 oz (89.867 kg)  06/29/13 204 lb 12.8 oz (92.897 kg)  06/01/13 198 lb 6.4 oz (89.994 kg)    Usual Body Weight: 220 lbs  % Usual Body Weight: 90%  BMI:  Body mass index is  33.16 kg/(m^2).  Estimated Nutritional Needs: Kcal: 1900-2200 Protein: 110-120 g Fluid: 2.2 L/day  Skin: Intact  Diet Order: NPO  EDUCATION NEEDS: -Education needs addressed   Intake/Output Summary (Last 24 hours) at 05/07/14 1429 Last data filed at 05/07/14 0806  Gross per 24 hour  Intake      0 ml  Output    600 ml  Net   -600 ml    Last BM: 8/15   Labs:   Recent Labs Lab 05/07/14 0455  NA 137  K 4.0  CL 100  CO2 25  BUN 9  CREATININE 0.45*  CALCIUM 8.7  GLUCOSE 107*    CBG (last 3)   Recent Labs  05/07/14 0252 05/07/14 0814 05/07/14 1237  GLUCAP 78 77 103*    Scheduled Meds: . atenolol  25 mg Oral q morning - 10a  . aztreonam  2 g Intravenous 3 times per day  . buPROPion  300 mg Oral q morning - 10a  . clonazePAM  1 mg Oral Daily  . clonazePAM  2 mg Oral QHS  . docusate sodium  200 mg Oral BID  . fentaNYL  100 mcg Transdermal Q72H  . fentaNYL  50 mcg Transdermal Q72H  . heparin  5,000 Units Subcutaneous 3 times per day  . HYDROmorphone  8 mg Oral TID  . insulin aspart  0-5 Units Subcutaneous QHS  . insulin aspart  0-9  Units Subcutaneous TID WC  . ipratropium-albuterol  3 mL Nebulization QID  . lamoTRIgine  100 mg Oral BID  . sodium phosphate  1 enema Rectal Once  . vancomycin  1,250 mg Intravenous Q12H    Continuous Infusions:   Past Medical History  Diagnosis Date  . Breast cancer   . Bipolar disorder   . Psoriasis   . Type 2 diabetes mellitus   . Essential hypertension, benign   . Mixed hyperlipidemia   . GERD (gastroesophageal reflux disease)   . Allergy   . Anxiety   . Depression   . Hx of migraines     chronic  . H/O sick sinus syndrome   . Hx of colonoscopy 2012    Past Surgical History  Procedure Laterality Date  . Breast surgery Bilateral   . Laparoscopy  1992    Terrace Arabia RD, LDN

## 2014-05-08 ENCOUNTER — Inpatient Hospital Stay (HOSPITAL_COMMUNITY): Payer: Medicare PPO

## 2014-05-08 DIAGNOSIS — F411 Generalized anxiety disorder: Secondary | ICD-10-CM

## 2014-05-08 LAB — CBC
HCT: 25.3 % — ABNORMAL LOW (ref 36.0–46.0)
HEMOGLOBIN: 8 g/dL — AB (ref 12.0–15.0)
MCH: 27.7 pg (ref 26.0–34.0)
MCHC: 31.6 g/dL (ref 30.0–36.0)
MCV: 87.5 fL (ref 78.0–100.0)
PLATELETS: 126 10*3/uL — AB (ref 150–400)
RBC: 2.89 MIL/uL — AB (ref 3.87–5.11)
RDW: 17.4 % — ABNORMAL HIGH (ref 11.5–15.5)
WBC: 3.8 10*3/uL — AB (ref 4.0–10.5)

## 2014-05-08 LAB — LEGIONELLA ANTIGEN, URINE: LEGIONELLA ANTIGEN, URINE: NEGATIVE

## 2014-05-08 LAB — GLUCOSE, CAPILLARY
GLUCOSE-CAPILLARY: 82 mg/dL (ref 70–99)
GLUCOSE-CAPILLARY: 89 mg/dL (ref 70–99)
Glucose-Capillary: 90 mg/dL (ref 70–99)
Glucose-Capillary: 125 mg/dL — ABNORMAL HIGH (ref 70–99)

## 2014-05-08 MED ORDER — PANTOPRAZOLE SODIUM 40 MG PO TBEC
40.0000 mg | DELAYED_RELEASE_TABLET | Freq: Every day | ORAL | Status: DC
  Administered 2014-05-08 – 2014-05-10 (×3): 40 mg via ORAL
  Filled 2014-05-08: qty 1

## 2014-05-08 MED ORDER — PANTOPRAZOLE SODIUM 40 MG PO TBEC: 40.0000 mg | DELAYED_RELEASE_TABLET | Freq: Every day | ORAL | Status: DC

## 2014-05-08 MED ORDER — ENSURE COMPLETE PO LIQD
237.0000 mL | Freq: Three times a day (TID) | ORAL | Status: DC
  Administered 2014-05-08 – 2014-05-10 (×6): 237 mL via ORAL

## 2014-05-08 NOTE — Progress Notes (Signed)
Subjective: Pt feels ok this am. No particular complaints. Denies SOB. Some discomfort at drain site  Objective: Physical Exam: BP 127/64  Pulse 97  Temp(Src) 98.6 F (37 C) (Oral)  Resp 16  Ht 5\' 5"  (1.651 m)  Wt 197 lb 15.6 oz (89.8 kg)  BMI 32.94 kg/m2  SpO2 94% Remains afebrile Drain site dressing remains dry. No further extension or erythema. Mildly tender.    Labs: CBC  Recent Labs  05/07/14 0455 05/08/14 0500  WBC 7.1 3.8*  HGB 8.5* 8.0*  HCT 26.7* 25.3*  PLT 141* 126*   BMET  Recent Labs  05/07/14 0455  NA 137  K 4.0  CL 100  CO2 25  GLUCOSE 107*  BUN 9  CREATININE 0.45*  CALCIUM 8.7   LFT  Recent Labs  05/07/14 0455  PROT 5.5*  ALBUMIN 2.5*  AST 25  ALT 14  ALKPHOS 180*  BILITOT 0.5   PT/INR  Recent Labs  05/07/14 0455  LABPROT 14.4  INR 1.12     Studies/Results: Ct Chest W Contrast  05/07/2014   CLINICAL DATA:  Infection in region of a left chest catheter.  EXAM: CT CHEST WITH CONTRAST  TECHNIQUE: Multidetector CT imaging of the chest was performed during intravenous contrast administration.  CONTRAST:  38mL OMNIPAQUE IOHEXOL 300 MG/ML  SOLN  COMPARISON:  Chest radiograph, 07/06/2013.  FINDINGS: There is a Port-A-Cath with its tip just above the caval atrial junction. There is no associated inflammation or fluid collection.  Along the left chest wall, bordering the pectoralis major, there is a fluid collection measuring 6 cm x 2.6 cm x 5.5 cm there is adjacent inflammatory stranding. Overlying skin is thickened. Patient has changes consistent with a left mastectomy. This collection could reflect a postoperative seroma.  A left chest tube enters the lateral inferior left hemi thorax extending along the posterior aspect of the thoracic cavity to have its tip near the left apex. There is a large left pleural effusion. The left lower lobe is collapsed. There are there is partial atelectasis of the left upper lobe.  There is fluid  attenuation in the subcutaneous soft tissues of the lateral inferior hemi thorax and upper abdomen, adjacent to the chest tube insertion site on the left. No defined collection is seen. This is incompletely imaged. It is likely edema.  There is no evidence of pulmonary edema. There are several areas of coarse reticular type opacity in the upper lobes. Mild dependent subsegmental atelectasis is noted in the right lower lobe. No discrete pulmonary mass or nodule is seen.  Heart is normal in size. No mediastinal or hilar masses. A mildly enlarged right precarinal lymph node is noted measuring 12 mm in short axis.  Below the diaphragm there is no significant abnormality.  No osteoblastic or osteolytic lesions.  IMPRESSION: 1. Fluid collection along the lateral margin of the left pectoralis muscle. This could reflect an abscess. It may reflect a postsurgical seroma. 2. No evidence of a fluid collection or inflammation associated with the right anterior chest wall Port-A-Cath. 3. Large left pleural effusion. Left lower lobe atelectasis and partial left upper lobe atelectasis. This is despite the presence of a well-positioned left-sided chest tube. 4. No pulmonary edema. No convincing metastatic disease to the lungs. A central obstructing lesion involving the left lower lobe bronchi should be considered in the proper clinical setting and may warrant bronchoscopy.   Electronically Signed   By: Lajean Manes M.D.   On: 05/07/2014 07:48  Assessment/Plan: Cellulitis at (L)pleurX catheter site, no obvious abscess, appearance on CT was more edematous than organized fluid collection. Pleural fluid cell count shows normal WBC counts, Culture negative so far. WBC remains normal Favor cont conservative management for now with IV abx and observation CXR ordered today, pt may need to be drained again today. 1L removed yesterday D/W pt that still a reasonable possibility that catheter will have to be removed if infection not  resolving with conservative management   LOS: 1 day    Ascencion Dike PA-C 05/08/2014 9:07 AM

## 2014-05-08 NOTE — Progress Notes (Signed)
PROGRESS NOTE  Claire Mcbride:[id number] DOB: 04-19-1961 DOA: 05/07/2014 PCP: Joseph Art, MD  HPI/Recap of past 24 hours: Patient with history of metastatic breast cancer and diabetes mellitus with recurrent pleural effusion with secondary Pleurx catheter came to outside hospital with complaints of draining pus from catheter and then transferred to this hospital for further care on 8/15 early morning.  Patient had CT done which notes no collection of abscess. Had further fluid drained and sent which does not look highly infectious. Fluid looks to be more edematous. Started on IV antibiotics.   Assessment/Plan: Principal Problem:   Pleural drain infection: Continue IV antibiotics. Likely will need to drain more fluid later today. At this time no indication to remove catheter.   Active Problems:   Breast cancer of upper-outer quadrant of left female breast: Being followed by oncology.   Bipolar 1 disorder: Stable. Continue home psych meds.    Obesity (BMI 30-39.9): Patient quit. BMI greater than 30.    Generalized anxiety disorder: Responding to anxiolytics    Constipation due to opioid therapy: On bowel regimen   Code Status: Full code  Family Communication: Left message with family  Disposition Plan: Home likely tomorrow. Need to remove more fluid off.   Consultants:  Interventional radiology  Procedures:  Status post 1 pleural fluid draining episode by nursing  Antibiotics:  IV aztreonam 8/15-present  IV vancomycin 8/15-present  HPI/Subjective: Patient doing okay. Less anxious today. She is relieved to hear that it does not look like she has an abscess  Objective: BP 127/64  Pulse 97  Temp(Src) 98.6 F (37 C) (Oral)  Resp 16  Ht 5\' 5"  (1.651 m)  Wt 89.8 kg (197 lb 15.6 oz)  BMI 32.94 kg/m2  SpO2 94%  Intake/Output Summary (Last 24 hours) at 05/08/14 1329 Last data filed at 05/08/14 0800  Gross per 24 hour  Intake    100 ml  Output      0 ml    Net    100 ml   Filed Weights   05/07/14 0103 05/07/14 0515 05/08/14 0605  Weight: 90.8 kg (200 lb 2.8 oz) 90.4 kg (199 lb 4.7 oz) 89.8 kg (197 lb 15.6 oz)    Exam:   General:  Alert and oriented x3, mildly anxious  Cardiovascular: Regular rate and rhythm, S1-S2  Respiratory: Decreased breath sounds left base  Abdomen: Soft, nontender, nondistended, positive bowel sounds  Musculoskeletal: No clubbing or cyanosis, trace edema   Data Reviewed: Basic Metabolic Panel:  Recent Labs Lab 05/07/14 0455  NA 137  K 4.0  CL 100  CO2 25  GLUCOSE 107*  BUN 9  CREATININE 0.45*  CALCIUM 8.7   Liver Function Tests:  Recent Labs Lab 05/07/14 0455  AST 25  ALT 14  ALKPHOS 180*  BILITOT 0.5  PROT 5.5*  ALBUMIN 2.5*   No results found for this basename: LIPASE, AMYLASE,  in the last 168 hours No results found for this basename: AMMONIA,  in the last 168 hours CBC:  Recent Labs Lab 05/07/14 0455 05/08/14 0500  WBC 7.1 3.8*  NEUTROABS 5.6  --   HGB 8.5* 8.0*  HCT 26.7* 25.3*  MCV 87.0 87.5  PLT 141* 126*   Cardiac Enzymes:   No results found for this basename: CKTOTAL, CKMB, CKMBINDEX, TROPONINI,  in the last 168 hours BNP (last 3 results) No results found for this basename: PROBNP,  in the last 8760 hours CBG:  Recent Labs Lab 05/07/14 1237 05/07/14  1647 05/07/14 2155 05/08/14 0750 05/08/14 1131  GLUCAP 103* 89 109* 82 89    Recent Results (from the past 240 hour(s))  BODY FLUID CULTURE     Status: None   Collection Time    05/07/14 12:56 PM      Result Value Ref Range Status   Specimen Description FLUID   Final   Special Requests PLEURAL   Final   Gram Stain     Final   Value: RARE WBC PRESENT, PREDOMINANTLY PMN     NO ORGANISMS SEEN     Performed at [facility]   Culture     Final   Value: NO GROWTH 1 DAY     Performed at [facility]   Report Status PENDING   Incomplete     Studies: Ct Chest W Contrast  05/07/2014     IMPRESSION: 1. Fluid collection along the lateral margin of the left pectoralis muscle. This could reflect an abscess. It may reflect a postsurgical seroma. 2. No evidence of a fluid collection or inflammation associated with the right anterior chest wall Port-A-Cath. 3. Large left pleural effusion. Left lower lobe atelectasis and partial left upper lobe atelectasis. This is despite the presence of a well-positioned left-sided chest tube. 4. No pulmonary edema. No convincing metastatic disease to the lungs. A central obstructing lesion involving the left lower lobe bronchi should be considered in the proper clinical setting and may warrant bronchoscopy.   Electronically Signed   By: Lajean Manes M.D.   On: 05/07/2014 07:48    Scheduled Meds: . atenolol  25 mg Oral q morning - 10a  . aztreonam  2 g Intravenous 3 times per day  . buPROPion  300 mg Oral q morning - 10a  . clonazePAM  1 mg Oral Daily  . clonazePAM  2 mg Oral QHS  . docusate sodium  200 mg Oral BID  . feeding supplement (ENSURE COMPLETE)  237 mL Oral TID BM  . fentaNYL  100 mcg Transdermal Q72H  . fentaNYL  50 mcg Transdermal Q72H  . heparin  5,000 Units Subcutaneous 3 times per day  . HYDROmorphone  8 mg Oral TID  . insulin aspart  0-5 Units Subcutaneous QHS  . insulin aspart  0-9 Units Subcutaneous TID WC  . lamoTRIgine  100 mg Oral BID  . pantoprazole  40 mg Oral Daily  . sodium phosphate  1 enema Rectal Once  . vancomycin  1,250 mg Intravenous Q12H    Continuous Infusions:    Time spent: 25 minutes  [facility] Pager [phone]. If 7PM-7AM, please contact night-coverage at [email or web address], password [email or web address] 05/08/2014, 1:29 PM  LOS: 1 day

## 2014-05-09 DIAGNOSIS — C50419 Malignant neoplasm of upper-outer quadrant of unspecified female breast: Secondary | ICD-10-CM

## 2014-05-09 LAB — BASIC METABOLIC PANEL
Anion gap: 13 (ref 5–15)
BUN: 8 mg/dL (ref 6–23)
CALCIUM: 8.9 mg/dL (ref 8.4–10.5)
CO2: 25 mEq/L (ref 19–32)
Chloride: 101 mEq/L (ref 96–112)
Creatinine, Ser: 0.48 mg/dL — ABNORMAL LOW (ref 0.50–1.10)
Glucose, Bld: 89 mg/dL (ref 70–99)
Potassium: 4.1 mEq/L (ref 3.7–5.3)
Sodium: 139 mEq/L (ref 137–147)

## 2014-05-09 LAB — CBC
HEMATOCRIT: 27.5 % — AB (ref 36.0–46.0)
HEMOGLOBIN: 8.5 g/dL — AB (ref 12.0–15.0)
MCH: 27.2 pg (ref 26.0–34.0)
MCHC: 30.9 g/dL (ref 30.0–36.0)
MCV: 87.9 fL (ref 78.0–100.0)
Platelets: 150 10*3/uL (ref 150–400)
RBC: 3.13 MIL/uL — ABNORMAL LOW (ref 3.87–5.11)
RDW: 17.4 % — ABNORMAL HIGH (ref 11.5–15.5)
WBC: 4.9 10*3/uL (ref 4.0–10.5)

## 2014-05-09 LAB — GLUCOSE, CAPILLARY
GLUCOSE-CAPILLARY: 81 mg/dL (ref 70–99)
GLUCOSE-CAPILLARY: 143 mg/dL — AB (ref 70–99)
GLUCOSE-CAPILLARY: 122 mg/dL — AB (ref 70–99)
Glucose-Capillary: 137 mg/dL — ABNORMAL HIGH (ref 70–99)

## 2014-05-09 LAB — VANCOMYCIN, TROUGH: Vancomycin Tr: 13.6 ug/mL (ref 10.0–20.0)

## 2014-05-09 LAB — PATHOLOGIST SMEAR REVIEW

## 2014-05-09 MED ORDER — BISACODYL 5 MG PO TBEC
5.0000 mg | DELAYED_RELEASE_TABLET | Freq: Once | ORAL | Status: AC
  Administered 2014-05-09: 5 mg via ORAL
  Filled 2014-05-09: qty 1

## 2014-05-09 MED ORDER — CLONAZEPAM 1 MG PO TABS: 2.0000 mg | ORAL_TABLET | Freq: Two times a day (BID) | ORAL | Status: DC

## 2014-05-09 MED ORDER — CLONAZEPAM 1 MG PO TABS
2.0000 mg | ORAL_TABLET | Freq: Three times a day (TID) | ORAL | Status: DC
  Administered 2014-05-09 – 2014-05-10 (×4): 2 mg via ORAL
  Filled 2014-05-09 (×4): qty 2

## 2014-05-09 MED ORDER — HYDROMORPHONE HCL PF 1 MG/ML IJ SOLN
1.0000 mg | Freq: Once | INTRAMUSCULAR | Status: AC
  Administered 2014-05-09: 1 mg via INTRAVENOUS
  Filled 2014-05-09: qty 1

## 2014-05-09 MED ORDER — CLONAZEPAM 1 MG PO TABS
1.0000 mg | ORAL_TABLET | Freq: Two times a day (BID) | ORAL | Status: DC
  Administered 2014-05-09: 1 mg via ORAL
  Filled 2014-05-09: qty 1

## 2014-05-09 MED ORDER — HYDROMORPHONE HCL PF 1 MG/ML IJ SOLN
0.5000 mg | INTRAMUSCULAR | Status: DC | PRN
  Administered 2014-05-09 – 2014-05-10 (×4): 0.5 mg via INTRAVENOUS
  Filled 2014-05-09 (×4): qty 1

## 2014-05-09 MED ORDER — IPRATROPIUM-ALBUTEROL 0.5-2.5 (3) MG/3ML IN SOLN
3.0000 mL | Freq: Three times a day (TID) | RESPIRATORY_TRACT | Status: DC
  Administered 2014-05-09 – 2014-05-11 (×5): 3 mL via RESPIRATORY_TRACT
  Filled 2014-05-09 (×6): qty 3

## 2014-05-09 MED ORDER — HYDROMORPHONE HCL PF 1 MG/ML IJ SOLN
2.0000 mg | Freq: Once | INTRAMUSCULAR | Status: AC
  Administered 2014-05-09: 2 mg via INTRAVENOUS
  Filled 2014-05-09: qty 2

## 2014-05-09 MED ORDER — ZOLPIDEM TARTRATE 5 MG PO TABS
5.0000 mg | ORAL_TABLET | Freq: Every day | ORAL | Status: AC
  Administered 2014-05-09: 5 mg via ORAL
  Filled 2014-05-09: qty 1

## 2014-05-09 MED ORDER — HYDROMORPHONE HCL 2 MG PO TABS
4.0000 mg | ORAL_TABLET | Freq: Every day | ORAL | Status: DC
  Administered 2014-05-09 – 2014-05-10 (×5): 4 mg via ORAL
  Filled 2014-05-09 (×5): qty 2

## 2014-05-09 NOTE — Progress Notes (Signed)
  Subjective: Left pleural pleurX catheter placed 8/7 Poss site infection Continues to be able to use cath   Objective: Vital signs in last 24 hours: Temp:  [97.8 F (36.6 C)-98.4 F (36.9 C)] 98.4 F (36.9 C) (08/17 0550) Pulse Rate:  [85-97] 90 (08/17 0550) Resp:  [16] 16 (08/17 0550) BP: (114-122)/(56-72) 122/61 mmHg (08/17 0550) SpO2:  [92 %-95 %] 94 % (08/17 0550) Last BM Date: 05/08/14  Intake/Output from previous day: 08/16 0701 - 08/17 0700 In: 650 [IV Piggyback:650] Out: -  Intake/Output this shift:    PE:  Afeb; vss Site with some redness  Sl tender Minimal discharge at site- yellowish Fluid from cath:  Neg Pt has large areas of scaling skin presumed from previous radiation treatment for beast ca Large are beneath Left breast are Smaller ares posterior chest Wbc wnl  Lab Results:   Recent Labs  05/07/14 0455 05/08/14 0500  WBC 7.1 3.8*  HGB 8.5* 8.0*  HCT 26.7* 25.3*  PLT 141* 126*   BMET  Recent Labs  05/07/14 0455  NA 137  K 4.0  CL 100  CO2 25  GLUCOSE 107*  BUN 9  CREATININE 0.45*  CALCIUM 8.7   PT/INR  Recent Labs  05/07/14 0455  LABPROT 14.4  INR 1.12   ABG No results found for this basename: PHART, PCO2, PO2, HCO3,  in the last 72 hours  Studies/Results: Dg Chest 1 View  05/08/2014   CLINICAL DATA:  Pleural effusion  EXAM: CHEST - 1 VIEW  COMPARISON:  Yesterday  FINDINGS: Left pleural effusion has improved but remains large. Associated volume loss and left lung. Stable right internal jugular Port-A-Cath. Tiny right effusion. Right lung clear.  IMPRESSION: Improved left pleural effusion.  A large pleural effusion persists.   Electronically Signed   By: Maryclare Bean M.D.   On: 05/08/2014 10:24    Anti-infectives: Anti-infectives   Start     Dose/Rate Route Frequency Ordered Stop   05/07/14 1000  vancomycin (VANCOCIN) 1,250 mg in sodium chloride 0.9 % 250 mL IVPB     1,250 mg 166.7 mL/hr over 90 Minutes Intravenous Every 12  hours 05/07/14 0949     05/07/14 0600  aztreonam (AZACTAM) 2 g in dextrose 5 % 50 mL IVPB     2 g 100 mL/hr over 30 Minutes Intravenous 3 times per day 05/07/14 0231 05/15/14 0559      Assessment/Plan: s/p * No surgery found * Cellulitis at Left pleurX site Some improved but apparent Cont Vanco If area does not get much better ---might need removal and pigtail instead til healed Will follow   LOS: 2 days    Samay Delcarlo A 05/09/2014

## 2014-05-09 NOTE — Progress Notes (Signed)
ANTIBIOTIC CONSULT NOTE - FOLLOW UP  Pharmacy Consult for Vancomycin + Azactam Indication: L-pleurX catheter site infection  Allergies  Allergen Reactions  . Bee Venom Anaphylaxis  . Penicillins Anaphylaxis and Other (See Comments)    Chest pain  . Relpax [Eletriptan] Anaphylaxis  . Bactrim [Sulfamethoxazole-Trimethoprim] Other (See Comments)    Chest pain  . Diclofenac Other (See Comments)    Chest pain  . Levofloxacin Other (See Comments)    Chest pain  . Lisinopril Diarrhea and Cough  . Losartan Diarrhea and Cough  . Morphine Other (See Comments)    Pt c/o chest pain with morphine  . Morphine And Related Other (See Comments)    Chest pain (Patient takes po hydromorphone at home.)  . Other Other (See Comments)    IV electrolyte solution with perservates; Ceftin, chest pain; cipro chest pain- pt unsure which antibiotic caused the chest pain IV electrolyte solution with perservates - turns arm red  . Pantoprazole Diarrhea  . Prednisone Other (See Comments)    Chest pain  . Protonix [Pantoprazole Sodium] Diarrhea  . Zantac [Ranitidine Hcl] Diarrhea  . Lidocaine Palpitations    Heart beats fast    Patient Measurements: Height: 5\' 5"  (165.1 cm) Weight: 197 lb 15.6 oz (89.8 kg) IBW/kg (Calculated) : 57  Vital Signs: Temp: 98.4 F (36.9 C) (08/17 1312) Temp src: Oral (08/17 1312) BP: 124/62 mmHg (08/17 1312) Pulse Rate: 92 (08/17 1312) Intake/Output from previous day: 08/16 0701 - 08/17 0700 In: 650 [IV Piggyback:650] Out: -  Intake/Output from this shift: Total I/O In: 240 [P.O.:240] Out: 350 [Urine:350]  Labs:  Recent Labs  05/07/14 0455 05/08/14 0500 05/09/14 0915  WBC 7.1 3.8* 4.9  HGB 8.5* 8.0* 8.5*  PLT 141* 126* 150  CREATININE 0.45*  --  0.48*   Estimated Creatinine Clearance: 91 ml/min (by C-G formula based on Cr of 0.48).  Recent Labs  05/09/14 0915  VANCOTROUGH 13.6     Microbiology: Recent Results (from the past 720 hour(s))  BODY  FLUID CULTURE     Status: None   Collection Time    05/07/14 12:56 PM      Result Value Ref Range Status   Specimen Description FLUID   Final   Special Requests PLEURAL   Final   Gram Stain     Final   Value: RARE WBC PRESENT, PREDOMINANTLY PMN     NO ORGANISMS SEEN     Performed at [facility]   Culture     Final   Value: NO GROWTH 2 DAYS     Performed at [facility]   Report Status PENDING   Incomplete    Anti-infectives   Start     Dose/Rate Route Frequency Ordered Stop   05/07/14 1000  vancomycin (VANCOCIN) 1,250 mg in sodium chloride 0.9 % 250 mL IVPB     1,250 mg 166.7 mL/hr over 90 Minutes Intravenous Every 12 hours 05/07/14 0949     05/07/14 0600  aztreonam (AZACTAM) 2 g in dextrose 5 % 50 mL IVPB     2 g 100 mL/hr over 30 Minutes Intravenous 3 times per day 05/07/14 0231 05/15/14 0559      Assessment: Claire Mcbride who continues on Vancomycin + Azactam for empiric coverage of a L-pleural pleurX catheter site infection. A Vancomycin trough this. morning was therapeutic (VT 13.5 mcg/ml, goal of 10-15 mcg/ml). Afebrile, WBC 4.9 - renal function remains stable - will continue current doses.   Goal of Therapy:  Vancomycin trough level 10-15 mcg/ml  Plan:  1. Continue Vancomycin 1250 mg IV every 12 hours 2. Continue Azactam 2g IV every 8 hours 3. Will continue to follow renal function, culture results, LOT, and antibiotic de-escalation plans   [name], PharmD, BCPS Clinical Pharmacist Pager: [phone] 05/09/2014 2:01 PM

## 2014-05-09 NOTE — Progress Notes (Signed)
PROGRESS NOTE  Claire Mcbride:[id number] DOB: [birth year] DOA: 05/07/2014 PCP: Joseph Art, MD  HPI/Recap of past 24 hours: Patient with history of metastatic breast cancer and diabetes mellitus with recurrent pleural effusion with secondary Pleurx catheter came to outside hospital with complaints of draining pus from catheter and then transferred to this hospital for further care on 8/15 early morning.  Underwent would drain x1.  Seen by interventional radiology.  Noted of some mild cellulitis surrounding catheter site. CT done noted that much of effusion seen edematous rather than organized fluid collection.  IV antibiotics started.  In last 24 hours, erythema somewhat improving.  Still having some thick drainage able to be excised from the catheter site.   Assessment/Plan: Principal Problem:   Pleural drain infection: Continue IV antibiotics. Some response antibiotics. We'll drain more fluid from Pleurx catheter today. Repeat CT next few days as per interventional radiology and decision can be made about managing conservatively versus Pleurx cath removal and placement of pigtail catheter  Active Problems:   Breast cancer of upper-outer quadrant of left female breast: Being followed by oncology.  She has associated metastatic skin lesions on her back which are quite tender causing her distress when she lays down. Will discuss with oncology about any treatment options.    Anxiety disorder/Bipolar 1 disorder: Stable. Continue home psych meds.   Obesity (BMI 30-39.9): Patient quit. BMI greater than 30.    Generalized anxiety disorder: patient overall is much more anxious. In review of her medications, med rec have a lower dose listed than what patient is prescribed at home. Have made this correction and hopefully this will help her     Constipation due to opioid therapy: On bowel regimen, added suppository   Code Status: Full code  Family Communication: Left message with  family  Disposition Plan: continue in hospital until decision made about patient responded well to antibiotics versus replacing drain   Consultants:  Interventional radiology  Procedures:  Status post 1 pleural fluid draining episode by nursing  Antibiotics:  IV aztreonam 8/15-present  IV vancomycin 8/15-present  HPI/Subjective: Patient quite in distress. Complains of pain on chest wall as well as back.  Objective: BP 124/62  Pulse 92  Temp(Src) 98.4 F (36.9 C) (Oral)  Resp 17  Ht 5\' 5"  (1.651 m)  Wt 89.8 kg (197 lb 15.6 oz)  BMI 32.94 kg/m2  SpO2 94%  Intake/Output Summary (Last 24 hours) at 05/09/14 1424 Last data filed at 05/09/14 1422  Gross per 24 hour  Intake    720 ml  Output    950 ml  Net   -230 ml   Filed Weights   05/07/14 0103 05/07/14 0515 05/08/14 0605  Weight: 90.8 kg (200 lb 2.8 oz) 90.4 kg (199 lb 4.7 oz) 89.8 kg (197 lb 15.6 oz)    Exam:   General:  Alert and oriented x3, quite anxious  Cardiovascular: Regular rate and rhythm, S1-S2  Respiratory: Decreased breath sounds left base  Abdomen: Soft, nontender, nondistended, positive bowel sounds  Musculoskeletal: No clubbing or cyanosis, trace edema   Skin: Pleurx catheter drain below left breast with surrounding erythema, although improved. Her back has a number of large areas of metastatic skin induration or tender to touch. No active drainage here  Data Reviewed: Basic Metabolic Panel:  Recent Labs Lab 05/07/14 0455 05/09/14 0915  NA 137 139  K 4.0 4.1  CL 100 101  CO2 25 25  GLUCOSE 107* 89  BUN 9 8  CREATININE 0.45* 0.48*  CALCIUM 8.7 8.9   Liver Function Tests:  Recent Labs Lab 05/07/14 0455  AST 25  ALT 14  ALKPHOS 180*  BILITOT 0.5  PROT 5.5*  ALBUMIN 2.5*   No results found for this basename: LIPASE, AMYLASE,  in the last 168 hours No results found for this basename: AMMONIA,  in the last 168 hours CBC:  Recent Labs Lab 05/07/14 0455 05/08/14 0500  05/09/14 0915  WBC 7.1 3.8* 4.9  NEUTROABS 5.6  --   --   HGB 8.5* 8.0* 8.5*  HCT 26.7* 25.3* 27.5*  MCV 87.0 87.5 87.9  PLT 141* 126* 150   Cardiac Enzymes:   No results found for this basename: CKTOTAL, CKMB, CKMBINDEX, TROPONINI,  in the last 168 hours BNP (last 3 results) No results found for this basename: PROBNP,  in the last 8760 hours CBG:  Recent Labs Lab 05/08/14 1131 05/08/14 1747 05/08/14 2146 05/09/14 0738 05/09/14 1203  GLUCAP 89 90 125* 81 137*    Recent Results (from the past 240 hour(s))  BODY FLUID CULTURE     Status: None   Collection Time    05/07/14 12:56 PM      Result Value Ref Range Status   Specimen Description FLUID   Final   Special Requests PLEURAL   Final   Gram Stain     Final   Value: RARE WBC PRESENT, PREDOMINANTLY PMN     NO ORGANISMS SEEN     Performed at [facility]   Culture     Final   Value: NO GROWTH 2 DAYS     Performed at [facility]   Report Status PENDING   Incomplete     Studies: Ct Chest W Contrast  05/07/2014    IMPRESSION: 1. Fluid collection along the lateral margin of the left pectoralis muscle. This could reflect an abscess. It may reflect a postsurgical seroma. 2. No evidence of a fluid collection or inflammation associated with the right anterior chest wall Port-A-Cath. 3. Large left pleural effusion. Left lower lobe atelectasis and partial left upper lobe atelectasis. This is despite the presence of a well-positioned left-sided chest tube. 4. No pulmonary edema. No convincing metastatic disease to the lungs. A central obstructing lesion involving the left lower lobe bronchi should be considered in the proper clinical setting and may warrant bronchoscopy.   Electronically Signed   By: Lajean Manes M.D.   On: 05/07/2014 07:48    Scheduled Meds: . atenolol  25 mg Oral q morning - 10a  . aztreonam  2 g Intravenous 3 times per day  . buPROPion  300 mg Oral q morning - 10a  . clonazePAM  1 mg Oral BID   . clonazePAM  2 mg Oral QHS  . docusate sodium  200 mg Oral BID  . feeding supplement (ENSURE COMPLETE)  237 mL Oral TID BM  . fentaNYL  100 mcg Transdermal Q72H  . fentaNYL  50 mcg Transdermal Q72H  . heparin  5,000 Units Subcutaneous 3 times per day  . HYDROmorphone  4 mg Oral 6 X Daily  . insulin aspart  0-5 Units Subcutaneous QHS  . insulin aspart  0-9 Units Subcutaneous TID WC  . ipratropium-albuterol  3 mL Nebulization TID  . lamoTRIgine  100 mg Oral BID  . pantoprazole  40 mg Oral Daily  . sodium phosphate  1 enema Rectal Once  . vancomycin  1,250 mg Intravenous Q12H  . zolpidem  5  mg Oral QHS    Continuous Infusions:    Time spent: 25 minutes  [place] [facility] Pager [phone]. If 7PM-7AM, please contact night-coverage at [email or web address], password [facility] 05/09/2014, 2:24 PM  LOS: 2 days

## 2014-05-09 NOTE — Consult Note (Signed)
WOC wound consult note Reason for Consult: erythema and induration on the patients back.  Reviewed chart and there is photo documentation of this area on Dr. Virgie Dad note from 04/28/14. The area may be a little larger but she has extensive skin changes on the left chest wall and across the patients back. No other lesions noted. Based on Dr. Virgie Dad note she has metastatic cancer with skin involvement noted from skin biopsy on 11/22/13.  Wound type: metastatic skin lesions  Pressure Ulcer POA: No Measurement: several patchy areas over the back and the left chest wall. I did not measure each of them. They do fall in a linear pattern across the mid back Wound bed: these areas are indurated and erythematous. They are not actively open or draining.   Drainage (amount, consistency, odor) none Periwound:intact Dressing procedure/placement/frequency: I would prefer that her oncologist recommend any topical care for these areas and since at her visit with Dr. Jana Hakim on 04/28/14 his plan did not advise any topical care I will not add anything at this time.    Follow up with oncologist as planned, if the areas begin to be open and draining reconsult and we will be glad to add topical care to manage exudate.    Re consult if needed, will not follow at this time. Thanks  Claire Mcbride, [place] [phone])

## 2014-05-09 NOTE — Care Management Note (Signed)
  Page 2 of 2   05/11/2014     8:36:36 AM CARE MANAGEMENT NOTE 05/11/2014  Patient:  MOKSHA, DORGAN   Account Number:  [id number]  Date Initiated:  05/09/2014  Documentation initiated by:  Magdalen Spatz  Subjective/Objective Assessment:     Action/Plan:   Anticipated DC Date:  05/09/2014   Anticipated DC Plan:  [place]         Choice offered to / List presented to:          [facility] arranged  HH-1 RN  HH-2 PT      Status of service:  Completed, signed off Medicare Important Message given?  YES (If response is "NO", the following Medicare IM given date fields will be blank) Date Medicare IM given:  05/09/2014 Medicare IM given by:  Magdalen Spatz Date Additional Medicare IM given:   Additional Medicare IM given by:    Discharge Disposition:  [place]  Per UR Regulation:  Reviewed for med. necessity/level of care/duration of stay  If discussed at [place] of Stay Meetings, dates discussed:    Comments:  05-11-14 Patient discharging today , Claiborne Billings at  [facility]  aware , orders and information faxed to [place] at [phone] . Magdalen Spatz RN BSN    05-10-14 Followed up with patient . She has decided to stay with   [facility] . Offered to call her father to discuss , patinet decline, stated she will call him. Magdalen Spatz RN BSN [phone]    05-09-14 Patient states she is active with  [facility] intake number [phone], she would like to change to [place].  Confirmed patient's face sheet information. Explained [facility] does not cover [facility] . Left a list of home health agencies with patient , she would like time to call friends to discuss home health before deciding on agency .  IM given and explained to patient .   Magdalen Spatz RN BSN [phone]

## 2014-05-09 NOTE — Progress Notes (Signed)
Agree.  Continue to monitor site.

## 2014-05-10 DIAGNOSIS — L03319 Cellulitis of trunk, unspecified: Secondary | ICD-10-CM

## 2014-05-10 DIAGNOSIS — L02219 Cutaneous abscess of trunk, unspecified: Secondary | ICD-10-CM

## 2014-05-10 LAB — GLUCOSE, CAPILLARY
GLUCOSE-CAPILLARY: 103 mg/dL — AB (ref 70–99)
Glucose-Capillary: 105 mg/dL — ABNORMAL HIGH (ref 70–99)
Glucose-Capillary: 109 mg/dL — ABNORMAL HIGH (ref 70–99)
Glucose-Capillary: 135 mg/dL — ABNORMAL HIGH (ref 70–99)

## 2014-05-10 LAB — BODY FLUID CULTURE: Culture: NO GROWTH

## 2014-05-10 MED ORDER — POTASSIUM CHLORIDE CRYS ER 20 MEQ PO TBCR
20.0000 meq | EXTENDED_RELEASE_TABLET | Freq: Every day | ORAL | Status: DC
  Administered 2014-05-10: 20 meq via ORAL
  Filled 2014-05-10 (×2): qty 1

## 2014-05-10 MED ORDER — FUROSEMIDE 20 MG PO TABS
20.0000 mg | ORAL_TABLET | Freq: Every day | ORAL | Status: DC
  Administered 2014-05-10: 20 mg via ORAL
  Filled 2014-05-10 (×2): qty 1

## 2014-05-10 MED ORDER — HYDROMORPHONE HCL 2 MG PO TABS
8.0000 mg | ORAL_TABLET | ORAL | Status: DC | PRN
  Administered 2014-05-10 – 2014-05-11 (×6): 8 mg via ORAL
  Filled 2014-05-10 (×6): qty 4

## 2014-05-10 MED ORDER — SENNOSIDES-DOCUSATE SODIUM 8.6-50 MG PO TABS
2.0000 | ORAL_TABLET | Freq: Two times a day (BID) | ORAL | Status: DC
  Administered 2014-05-10 (×2): 2 via ORAL
  Filled 2014-05-10: qty 2

## 2014-05-10 NOTE — Progress Notes (Addendum)
[facility] PROGRESS NOTE  Claire Mcbride NWG:[id number] DOB: June 21, 1961 DOA: 05/07/2014 PCP: Joseph Art, MD  Assessment/Plan: Patient with history of metastatic breast cancer and diabetes mellitus with recurrent pleural effusion with secondary Pleurx catheter came to outside hospital with complaints of draining pus from catheter and then transferred to this hospital for further care on 8/15 early morning. Underwent drain x1. Seen by interventional radiology. Noted of some mild cellulitis surrounding catheter site. CT done noted that much of effusion seen edematous rather than organized fluid collection.  In last 24 hours, erythema somewhat improving on IV atx.    1. Pleural drain site infection, cellulitis  -improving on IV antibiotics. Afebrile,. No leukocytosis; Wound cultures NGTD; cont antibiotics.   2. Metastatic Breast cancer of upper-outer quadrant of left female breast: Being followed by oncology.  -patient reports having appointment with her oncology on this Friday and coming Monday; cont OP close monitoring, Rx per oncology .  3. Anxiety disorder/Bipolar 1 disorder: Stable. Continue home psych meds.  4. Obesity (BMI 30-39.9): Patient quit. BMI greater than 30.  5. Constipation due to opioid therapy: On bowel regimen, added suppository 6. Chronic pain, CA related; patient reports being on fentanyl 150 mcg; and dilaudid 8 mg Q3 prn at home;  -resume home regimen, monitor for side effects  7. Edema, malignant pleural effusion;  -Leg edema, mild ascites; chronic CHF; echo: LVEF: 57-84%, grade 1 diastolic dysfunction -start low dose diuretics; potassium supplements; outpatient follow up with renal panel in 1 week     Code Status: full Family Communication:  D/w patient, her father  (indicate person spoken with, relationship, and if by phone, the number) Disposition Plan: home 24-48 hrs; needs [place]   Consultants:  IR  Procedures:  none  Antibiotics: IV aztreonam  8/15-present IV vancomycin 8/15-present   HPI/Subjective: alert  Objective: Filed Vitals:   05/10/14 0940  BP: 110/49  Pulse: 98  Temp:   Resp:     Intake/Output Summary (Last 24 hours) at 05/10/14 1127 Last data filed at 05/10/14 0900  Gross per 24 hour  Intake   1590 ml  Output   1400 ml  Net    190 ml   Filed Weights   05/07/14 0515 05/08/14 0605 05/10/14 0624  Weight: 90.4 kg (199 lb 4.7 oz) 89.8 kg (197 lb 15.6 oz) 87.6 kg (193 lb 2 oz)    Exam:   General:  alert  Cardiovascular: s1,s2 rrr  Respiratory: diminished in L  Abdomen: soft, nt,nd   Musculoskeletal: mild edema    Data Reviewed: Basic Metabolic Panel:  Recent Labs Lab 05/07/14 0455 05/09/14 0915  NA 137 139  K 4.0 4.1  CL 100 101  CO2 25 25  GLUCOSE 107* 89  BUN 9 8  CREATININE 0.45* 0.48*  CALCIUM 8.7 8.9   Liver Function Tests:  Recent Labs Lab 05/07/14 0455  AST 25  ALT 14  ALKPHOS 180*  BILITOT 0.5  PROT 5.5*  ALBUMIN 2.5*   No results found for this basename: LIPASE, AMYLASE,  in the last 168 hours No results found for this basename: AMMONIA,  in the last 168 hours CBC:  Recent Labs Lab 05/07/14 0455 05/08/14 0500 05/09/14 0915  WBC 7.1 3.8* 4.9  NEUTROABS 5.6  --   --   HGB 8.5* 8.0* 8.5*  HCT 26.7* 25.3* 27.5*  MCV 87.0 87.5 87.9  PLT 141* 126* 150   Cardiac Enzymes: No results found for this basename: CKTOTAL, CKMB, CKMBINDEX, TROPONINI,  in the last 168 hours BNP (last 3 results) No results found for this basename: PROBNP,  in the last 8760 hours CBG:  Recent Labs Lab 05/09/14 0738 05/09/14 1203 05/09/14 1656 05/09/14 2141 05/10/14 0749  GLUCAP 81 137* 143* 122* 103*    Recent Results (from the past 240 hour(s))  BODY FLUID CULTURE     Status: None   Collection Time    05/07/14 12:56 PM      Result Value Ref Range Status   Specimen Description FLUID   Final   Special Requests PLEURAL   Final   Gram Stain     Final   Value: RARE WBC  PRESENT, PREDOMINANTLY PMN     NO ORGANISMS SEEN     Performed at [facility]   Culture     Final   Value: NO GROWTH 2 DAYS     Performed at [facility]   Report Status PENDING   Incomplete     Studies: No results found.  Scheduled Meds: . atenolol  25 mg Oral q morning - 10a  . aztreonam  2 g Intravenous 3 times per day  . buPROPion  300 mg Oral q morning - 10a  . clonazePAM  2 mg Oral TID  . docusate sodium  200 mg Oral BID  . feeding supplement (ENSURE COMPLETE)  237 mL Oral TID BM  . fentaNYL  100 mcg Transdermal Q72H  . fentaNYL  50 mcg Transdermal Q72H  . heparin  5,000 Units Subcutaneous 3 times per day  . insulin aspart  0-5 Units Subcutaneous QHS  . insulin aspart  0-9 Units Subcutaneous TID WC  . ipratropium-albuterol  3 mL Nebulization TID  . lamoTRIgine  100 mg Oral BID  . pantoprazole  40 mg Oral Daily  . sodium phosphate  1 enema Rectal Once  . vancomycin  1,250 mg Intravenous Q12H   Continuous Infusions:   Principal Problem:   Pleural drain infection Active Problems:   Breast cancer of upper-outer quadrant of left female breast   Bipolar 1 disorder   Obesity (BMI 30-39.9)   Generalized anxiety disorder   Constipation due to opioid therapy    Time spent: >35 minutes     Kinnie Feil  [facility] Pager [phone]. If 7PM-7AM, please contact night-coverage at [email or web address], password [facility] 05/10/2014, 11:27 AM  LOS: 3 days

## 2014-05-10 NOTE — Progress Notes (Signed)
  Subjective: L PleurX catheter placed for malignant effusion 8/7 Possible infection at site CT shows no abscess in area Pt up in room today Seems better  Objective: Vital signs in last 24 hours: Temp:  [97.5 F (36.4 C)-98.4 F (36.9 C)] 98.1 F (36.7 C) (08/18 0624) Pulse Rate:  [92-98] 98 (08/18 0624) Resp:  [17-19] 19 (08/18 0624) BP: (116-124)/(59-62) 122/59 mmHg (08/18 0624) SpO2:  [92 %-96 %] 96 % (08/18 0624) Weight:  [87.6 kg (193 lb 2 oz)] 87.6 kg (193 lb 2 oz) (08/18 0624) Last BM Date: 05/09/14  Intake/Output from previous day: 08/17 0701 - 08/18 0700 In: 0383 [P.O.:1440; IV Piggyback:150] Out: 1750 [Urine:950; Chest Tube:800] Intake/Output this shift:    PE:  Afeb; vss Wbc-wnl Site dressed in op site today Dressing clean and dry Skin surrounding are is sl pink Looking better daily   Lab Results:   Recent Labs  05/08/14 0500 05/09/14 0915  WBC 3.8* 4.9  HGB 8.0* 8.5*  HCT 25.3* 27.5*  PLT 126* 150   BMET  Recent Labs  05/09/14 0915  NA 139  K 4.1  CL 101  CO2 25  GLUCOSE 89  BUN 8  CREATININE 0.48*  CALCIUM 8.9   PT/INR No results found for this basename: LABPROT, INR,  in the last 72 hours ABG No results found for this basename: PHART, PCO2, PO2, HCO3,  in the last 72 hours  Studies/Results: Dg Chest 1 View  05/08/2014   CLINICAL DATA:  Pleural effusion  EXAM: CHEST - 1 VIEW  COMPARISON:  Yesterday  FINDINGS: Left pleural effusion has improved but remains large. Associated volume loss and left lung. Stable right internal jugular Port-A-Cath. Tiny right effusion. Right lung clear.  IMPRESSION: Improved left pleural effusion.  A large pleural effusion persists.   Electronically Signed   By: Maryclare Bean M.D.   On: 05/08/2014 10:24    Anti-infectives: Anti-infectives   Start     Dose/Rate Route Frequency Ordered Stop   05/07/14 1000  vancomycin (VANCOCIN) 1,250 mg in sodium chloride 0.9 % 250 mL IVPB     1,250 mg 166.7 mL/hr over 90  Minutes Intravenous Every 12 hours 05/07/14 0949     05/07/14 0600  aztreonam (AZACTAM) 2 g in dextrose 5 % 50 mL IVPB     2 g 100 mL/hr over 30 Minutes Intravenous 3 times per day 05/07/14 0231 05/15/14 0559      Assessment/Plan: s/p * No surgery found *  L pleurX site better daily Afe; wbc wnl Cont antibx Call us if need [place]   LOS: 3 days    Jeana Kersting A 05/10/2014

## 2014-05-11 MED ORDER — DOXYCYCLINE HYCLATE 100 MG PO TABS: 100.0000 mg | ORAL_TABLET | Freq: Two times a day (BID) | ORAL | Status: AC

## 2014-05-11 MED ORDER — HEPARIN SOD (PORK) LOCK FLUSH 100 UNIT/ML IV SOLN
500.0000 [IU] | INTRAVENOUS | Status: AC | PRN
  Administered 2014-05-11: 500 [IU]

## 2014-05-11 MED ORDER — POTASSIUM CHLORIDE CRYS ER 20 MEQ PO TBCR: 20.0000 meq | EXTENDED_RELEASE_TABLET | ORAL | Status: AC

## 2014-05-11 MED ORDER — IPRATROPIUM-ALBUTEROL 0.5-2.5 (3) MG/3ML IN SOLN: 3.0000 mL | Freq: Two times a day (BID) | RESPIRATORY_TRACT | Status: DC

## 2014-05-11 MED ORDER — FUROSEMIDE 20 MG PO TABS: 20.0000 mg | ORAL_TABLET | ORAL | Status: AC

## 2014-05-11 NOTE — Discharge Summary (Signed)
Physician Discharge Summary  Claire Mcbride KGM:[id number] DOB: [birth year] DOA: 05/07/2014  PCP: Joseph Art, MD  Admit date: 05/07/2014 Discharge date: 05/11/2014  Time spent: >35 minutes  Recommendations for Outpatient Follow-up:  HHC F/u with PCP in 1 week  F/u with oncology in 1 week   Discharge Diagnoses:  Principal Problem:   Pleural drain infection Active Problems:   Breast cancer of upper-outer quadrant of left female breast   Bipolar 1 disorder   Obesity (BMI 30-39.9)   Generalized anxiety disorder   Constipation due to opioid therapy   Discharge Condition: stable   Diet recommendation: low sodium   Filed Weights   05/08/14 0605 05/10/14 0624 05/11/14 0603  Weight: 89.8 kg (197 lb 15.6 oz) 87.6 kg (193 lb 2 oz) 92.2 kg (203 lb 4.2 oz)    History of present illness:  Patient with history of metastatic breast cancer and diabetes mellitus with recurrent pleural effusion with secondary Pleurx catheter came to outside hospital with complaints of draining pus from catheter and then transferred to this hospital for further care on 8/15 early morning. Underwent drain x1. Seen by interventional radiology. Noted of some mild cellulitis surrounding catheter site. CT done noted that much of effusion seen edematous rather than organized fluid collection.  In last 24 hours, erythema improving on IV atx.    Hospital Course:  1. Pleural drain site infection, cellulitis chest wall -improved on IV antibiotics. Afebrile,. No leukocytosis; Wound cultures NGTD; changed to PO antibiotics.  -cont HHC for pleural drain 2. Metastatic Breast cancer of upper-outer quadrant of left female breast: Being followed by oncology.  -patient reports having appointment with her oncology on this Friday and coming Monday; cont OP close monitoring, Rx per oncology .  3. Anxiety disorder/Bipolar 1 disorder: Stable. Continue home psych meds.  4. Obesity (BMI 30-39.9): Patient quit. BMI greater than 30.   5. Constipation due to opioid therapy: On bowel regimen, added suppository  6. Chronic pain, CA related; patient reports being on fentanyl 150 mcg; and dilaudid 8 mg Q3 prn at home;  -resume home regimen, monitor for side effects  7. Edema, malignant pleural effusion;  -Leg edema, mild ascites; chronic CHF; echo: LVEF: 64-40%, grade 1 diastolic dysfunction  -start low dose diuretic Q48 hrs; potassium supplements; outpatient follow up with renal panel in 1 week     Procedures:  none (i.e. Studies not automatically included, echos, thoracentesis, etc; not x-rays)  Consultations:  IR  Discharge Exam: Filed Vitals:   05/11/14 0535  BP: 109/53  Pulse: 96  Temp: 98 F (36.7 C)  Resp: 18    General: alert Cardiovascular: s1,s2 rrr Respiratory: CTA BL  Discharge Instructions  Discharge Instructions   Diet - low sodium heart healthy    Complete by:  As directed      Discharge instructions    Complete by:  As directed   Please follow up with primary care doctor in 1 week  Please follow up with oncology in 1 week     Increase activity slowly    Complete by:  As directed             Medication List         albuterol 108 (90 BASE) MCG/ACT inhaler  Commonly known as:  PROVENTIL HFA;VENTOLIN HFA  Inhale 2 puffs into the lungs every 4 (four) hours as needed for wheezing or shortness of breath.     atenolol 25 MG tablet  Commonly known as:  TENORMIN  Take 25 mg by mouth every morning.     buPROPion 300 MG 24 hr tablet  Commonly known as:  WELLBUTRIN XL  Take 300 mg by mouth every morning.     clonazePAM 2 MG tablet  Commonly known as:  KLONOPIN  Take 2-4 mg by mouth 3 (three) times daily. The patient takes 2 mg (1 tablet) in the morning and at lunch - and anywhere from 2-4 mg (1-2 tablets) at bedtime - however the patient states this is usually 1 tablet (2 mg)     docusate sodium 100 MG capsule  Commonly known as:  COLACE  Take 100 mg by mouth daily as needed for  mild constipation.     doxycycline 100 MG tablet  Commonly known as:  VIBRA-TABS  Take 1 tablet (100 mg total) by mouth 2 (two) times daily.     EPIPEN 0.3 mg/0.3 mL Soaj injection  Generic drug:  EPINEPHrine  Inject 0.3 mg into the muscle as needed (For anaphyaxis.).     ergocalciferol 50000 UNITS capsule  Commonly known as:  VITAMIN D2  Take 50,000 Units by mouth every 14 (fourteen) days.     fentaNYL 100 MCG/HR  Commonly known as:  DURAGESIC - dosed mcg/hr  Place 1 patch (100 mcg total) onto the skin every 3 (three) days.     fentaNYL 50 MCG/HR  Commonly known as:  DURAGESIC - dosed mcg/hr  Place 1 patch (50 mcg total) onto the skin every 3 (three) days.     furosemide 20 MG tablet  Commonly known as:  LASIX  Take 1 tablet (20 mg total) by mouth every other day.     glimepiride 4 MG tablet  Commonly known as:  AMARYL  Take 4 mg by mouth 2 (two) times daily.     HYDROmorphone 8 MG tablet  Commonly known as:  DILAUDID  Take 8 mg by mouth every 3 (three) hours as needed for moderate pain or severe pain.     ipratropium-albuterol 0.5-2.5 (3) MG/3ML Soln  Commonly known as:  DUONEB  Take 3 mLs by nebulization 4 (four) times daily.     lamoTRIgine 100 MG tablet  Commonly known as:  LAMICTAL  Take 100 mg by mouth 2 (two) times daily.     lansoprazole 30 MG capsule  Commonly known as:  PREVACID  Take 30 mg by mouth 2 (two) times daily.     MAXIMUM RED KRILL PO  Take 1,500 mg by mouth every morning.     naproxen 250 MG tablet  Commonly known as:  NAPROSYN  Take 250 mg by mouth 2 (two) times daily as needed (For pain.).     potassium chloride SA 20 MEQ tablet  Commonly known as:  K-DUR,KLOR-CON  Take 1 tablet (20 mEq total) by mouth every other day.       Allergies  Allergen Reactions  . Bee Venom Anaphylaxis  . Penicillins Anaphylaxis and Other (See Comments)    Chest pain  . Relpax [Eletriptan] Anaphylaxis  . Bactrim [Sulfamethoxazole-Trimethoprim] Other  (See Comments)    Chest pain  . Diclofenac Other (See Comments)    Chest pain  . Levofloxacin Other (See Comments)    Chest pain  . Lisinopril Diarrhea and Cough  . Losartan Diarrhea and Cough  . Morphine Other (See Comments)    Pt c/o chest pain with morphine  . Morphine And Related Other (See Comments)    Chest pain (Patient takes po hydromorphone at home.)  .  Other Other (See Comments)    IV electrolyte solution with perservates; Ceftin, chest pain; cipro chest pain- pt unsure which antibiotic caused the chest pain IV electrolyte solution with perservates - turns arm red  . Pantoprazole Diarrhea  . Prednisone Other (See Comments)    Chest pain  . Protonix [Pantoprazole Sodium] Diarrhea  . Zantac [Ranitidine Hcl] Diarrhea  . Lidocaine Palpitations    Heart beats fast       Follow-up Information   Follow up with Joaquim Lai T, MD. Schedule an appointment as soon as possible for a visit in 1 week.   Specialty:  Internal Medicine   Contact information:   [address] [place] [address] [phone]        The results of significant diagnostics from this hospitalization (including imaging, microbiology, ancillary and laboratory) are listed below for reference.    Significant Diagnostic Studies: Dg Chest 1 View  05/08/2014   CLINICAL DATA:  Pleural effusion  EXAM: CHEST - 1 VIEW  COMPARISON:  Yesterday  FINDINGS: Left pleural effusion has improved but remains large. Associated volume loss and left lung. Stable right internal jugular Port-A-Cath. Tiny right effusion. Right lung clear.  IMPRESSION: Improved left pleural effusion.  A large pleural effusion persists.   Electronically Signed   By: Maryclare Bean M.D.   On: 05/08/2014 10:24   Ct Chest W Contrast  05/07/2014   CLINICAL DATA:  Infection in region of a left chest catheter.  EXAM: CT CHEST WITH CONTRAST  TECHNIQUE: Multidetector CT imaging of the chest was performed during intravenous contrast  administration.  CONTRAST:  51mL OMNIPAQUE IOHEXOL 300 MG/ML  SOLN  COMPARISON:  Chest radiograph, 07/06/2013.  FINDINGS: There is a Port-A-Cath with its tip just above the caval atrial junction. There is no associated inflammation or fluid collection.  Along the left chest wall, bordering the pectoralis major, there is a fluid collection measuring 6 cm x 2.6 cm x 5.5 cm there is adjacent inflammatory stranding. Overlying skin is thickened. Patient has changes consistent with a left mastectomy. This collection could reflect a postoperative seroma.  A left chest tube enters the lateral inferior left hemi thorax extending along the posterior aspect of the thoracic cavity to have its tip near the left apex. There is a large left pleural effusion. The left lower lobe is collapsed. There are there is partial atelectasis of the left upper lobe.  There is fluid attenuation in the subcutaneous soft tissues of the lateral inferior hemi thorax and upper abdomen, adjacent to the chest tube insertion site on the left. No defined collection is seen. This is incompletely imaged. It is likely edema.  There is no evidence of pulmonary edema. There are several areas of coarse reticular type opacity in the upper lobes. Mild dependent subsegmental atelectasis is noted in the right lower lobe. No discrete pulmonary mass or nodule is seen.  Heart is normal in size. No mediastinal or hilar masses. A mildly enlarged right precarinal lymph node is noted measuring 12 mm in short axis.  Below the diaphragm there is no significant abnormality.  No osteoblastic or osteolytic lesions.  IMPRESSION: 1. Fluid collection along the lateral margin of the left pectoralis muscle. This could reflect an abscess. It may reflect a postsurgical seroma. 2. No evidence of a fluid collection or inflammation associated with the right anterior chest wall Port-A-Cath. 3. Large left pleural effusion. Left lower lobe atelectasis and partial left upper lobe  atelectasis. This is despite the presence  of a well-positioned left-sided chest tube. 4. No pulmonary edema. No convincing metastatic disease to the lungs. A central obstructing lesion involving the left lower lobe bronchi should be considered in the proper clinical setting and may warrant bronchoscopy.   Electronically Signed   By: Lajean Manes M.D.   On: 05/07/2014 07:48   Ir Guided Niel Hummer W Catheter Placement  04/29/2014   CLINICAL DATA:  Breast carcinoma with large malignant left pleural effusion.  EXAM: INSERTION OF TUNNELED LEFT SIDED PLEURAL DRAINAGE CATHETER  COMPARISON:  None.  MEDICATIONS: VANCOMYCIN 1 G; Antibiotic was administered in an appropriate time interval for the procedure.  ANESTHESIA/SEDATION: Intravenous Fentanyl and Versed were administered as conscious sedation during continuous cardiorespiratory monitoring by the radiology RN, with a total moderate sedation time of eighteen minutes.  FLUOROSCOPY TIME:  FLUOROSCOPY TIME 6seconds  COMPLICATIONS: None immediate  PROCEDURE: The procedure, risks, benefits, and alternatives were explained to the patient and father, who wish to proceed with the placement of this permanent pleural catheter as the patient is seeking palliative care. The patient understand and consent to the procedure.  Survey ultrasound of the left hemi thorax was performed and an appropriate site uninvolved by overt macroscopic disease was selected.  The left lateral chest and upper abdomen were prepped with Chlorhexidine in a sterile fashion, and a sterile drape was applied covering the operative field. A sterile gown and sterile gloves were used for the procedure. Initial ultrasound scanning and fluoroscopic imaging demonstrates a recurrent moderate to large pleural effusion.  Under ultrasound guidance, the left inferior lateral pleural space was accessed with a sheath needle after the overlying soft tissues were anesthetized with 1% nesacaine. An Amplatz super stiff wire was  then advanced under fluoroscopy into the pleural space.  A 15.5 French tunneled Pleur-X catheter was tunneled from an incision within the left upper abdominal quadrant to the access site. The pleural access site was serially dilated under fluoroscopy, ultimately allowing placement of a peel-away sheath. The catheter was advanced through the peel-away sheath. The sheath was then removed. Final catheter positioning was confirmed with a fluoroscopic radiographic image.  The access incision was closed with Dermabond . A Prolene retention suture was applied at the catheter exit site. Large volume thoracentesis was performed through the new catheter utilizing provided bulb vacuum assisted drainage bag. The patient tolerated the above procedure well without immediate postprocedural complication.  FINDINGS: Preprocedural ultrasound scanning demonstrates a large left sided pleural effusion.  After ultrasound and fluoroscopic guided placement, the catheter is directed cephalad towards the apex.  Following catheter placement, approximately 1600 cc of blood tinged pleural fluid was removed.  IMPRESSION: Successful placement of permanent, tunneled left PleurX drainage catheter via lateral approach. 1.6 liters of blood-tinged pleural fluid was removed after catheter placement.   Electronically Signed   By: Arne Cleveland M.D.   On: 04/29/2014 17:28    Microbiology: Recent Results (from the past 240 hour(s))  BODY FLUID CULTURE     Status: None   Collection Time    05/07/14 12:56 PM      Result Value Ref Range Status   Specimen Description FLUID   Final   Special Requests PLEURAL   Final   Gram Stain     Final   Value: RARE WBC PRESENT, PREDOMINANTLY PMN     NO ORGANISMS SEEN     Performed at [facility]   Culture     Final   Value: NO GROWTH 3 DAYS  Performed at [facility]   Report Status 05/10/2014 FINAL   Final     Labs: Basic Metabolic Panel:  Recent Labs Lab 05/07/14 0455  05/09/14 0915  NA 137 139  K 4.0 4.1  CL 100 101  CO2 25 25  GLUCOSE 107* 89  BUN 9 8  CREATININE 0.45* 0.48*  CALCIUM 8.7 8.9   Liver Function Tests:  Recent Labs Lab 05/07/14 0455  AST 25  ALT 14  ALKPHOS 180*  BILITOT 0.5  PROT 5.5*  ALBUMIN 2.5*   No results found for this basename: LIPASE, AMYLASE,  in the last 168 hours No results found for this basename: AMMONIA,  in the last 168 hours CBC:  Recent Labs Lab 05/07/14 0455 05/08/14 0500 05/09/14 0915  WBC 7.1 3.8* 4.9  NEUTROABS 5.6  --   --   HGB 8.5* 8.0* 8.5*  HCT 26.7* 25.3* 27.5*  MCV 87.0 87.5 87.9  PLT 141* 126* 150   Cardiac Enzymes: No results found for this basename: CKTOTAL, CKMB, CKMBINDEX, TROPONINI,  in the last 168 hours BNP: BNP (last 3 results) No results found for this basename: PROBNP,  in the last 8760 hours CBG:  Recent Labs Lab 05/09/14 2141 05/10/14 0749 05/10/14 1152 05/10/14 1705 05/10/14 2118  GLUCAP 122* 103* 105* 109* 135*       Signed:  Jheremy Boger N  [facility] 05/11/2014, 8:10 AM

## 2014-05-11 NOTE — Progress Notes (Signed)
Discharge instructions and prescriptions given and explained to pt.  Pt verbalizes understanding of all orders/instructions.  All questions answered.  VSS. Pt refusing all 1000 meds d/t to discharge today.  IV team paged to deaccess portacath.  Pt ride (father) is on the way. Syliva Overman

## 2014-05-11 NOTE — Discharge Instructions (Signed)
[place] [phone]

## 2014-05-11 NOTE — Progress Notes (Signed)
IV team deaccessed portacath and pt discharged to home with father with all belongings. Claire Mcbride

## 2014-05-11 NOTE — Progress Notes (Signed)
Porta cath deaccessed. Flushed with 10cc NS followed by Heparin 84ml (100u/ml). Slight bleeding to site, gauze dressing to site. Catalina Pizza

## 2014-05-13 ENCOUNTER — Ambulatory Visit (HOSPITAL_COMMUNITY)
Admission: RE | Admit: 2014-05-13 | Discharge: 2014-05-13 | Disposition: A | Discharge status: Home / Self Care | Payer: Medicare PPO | Source: Ambulatory Visit | Attending: Oncology | Admitting: Oncology

## 2014-05-13 DIAGNOSIS — C7952 Secondary malignant neoplasm of bone marrow: Secondary | ICD-10-CM

## 2014-05-13 DIAGNOSIS — C50919 Malignant neoplasm of unspecified site of unspecified female breast: Secondary | ICD-10-CM | POA: Insufficient documentation

## 2014-05-13 DIAGNOSIS — C787 Secondary malignant neoplasm of liver and intrahepatic bile duct: Secondary | ICD-10-CM | POA: Insufficient documentation

## 2014-05-13 DIAGNOSIS — C7970 Secondary malignant neoplasm of unspecified adrenal gland: Secondary | ICD-10-CM | POA: Diagnosis not present

## 2014-05-13 DIAGNOSIS — C50912 Malignant neoplasm of unspecified site of left female breast: Secondary | ICD-10-CM

## 2014-05-13 DIAGNOSIS — C779 Secondary and unspecified malignant neoplasm of lymph node, unspecified: Secondary | ICD-10-CM

## 2014-05-13 DIAGNOSIS — C7951 Secondary malignant neoplasm of bone: Secondary | ICD-10-CM | POA: Insufficient documentation

## 2014-05-13 DIAGNOSIS — C778 Secondary and unspecified malignant neoplasm of lymph nodes of multiple regions: Secondary | ICD-10-CM | POA: Diagnosis not present

## 2014-05-13 LAB — GLUCOSE, CAPILLARY: Glucose-Capillary: 74 mg/dL (ref 70–99)

## 2014-05-13 MED ORDER — FLUDEOXYGLUCOSE F - 18 (FDG) INJECTION: 9.8000 | Freq: Once | INTRAVENOUS | Status: AC | PRN

## 2014-05-16 ENCOUNTER — Encounter: Payer: Self-pay | Admitting: Nurse Practitioner

## 2014-05-16 ENCOUNTER — Other Ambulatory Visit (HOSPITAL_BASED_OUTPATIENT_CLINIC_OR_DEPARTMENT_OTHER): Payer: Medicare PPO

## 2014-05-16 ENCOUNTER — Other Ambulatory Visit (HOSPITAL_BASED_OUTPATIENT_CLINIC_OR_DEPARTMENT_OTHER): Payer: Medicare PPO | Admitting: Nurse Practitioner

## 2014-05-16 ENCOUNTER — Telehealth: Payer: Self-pay | Admitting: Nurse Practitioner

## 2014-05-16 ENCOUNTER — Ambulatory Visit (HOSPITAL_BASED_OUTPATIENT_CLINIC_OR_DEPARTMENT_OTHER): Payer: Medicare PPO | Admitting: Nurse Practitioner

## 2014-05-16 ENCOUNTER — Other Ambulatory Visit: Payer: Self-pay | Admitting: *Deleted

## 2014-05-16 VITALS — BP 101/47 | HR 93 | Temp 98.4°F | Resp 18 | Ht 65.0 in | Wt 203.2 lb

## 2014-05-16 DIAGNOSIS — C50412 Malignant neoplasm of upper-outer quadrant of left female breast: Secondary | ICD-10-CM

## 2014-05-16 DIAGNOSIS — T402X5A Adverse effect of other opioids, initial encounter: Secondary | ICD-10-CM

## 2014-05-16 DIAGNOSIS — C50219 Malignant neoplasm of upper-inner quadrant of unspecified female breast: Secondary | ICD-10-CM

## 2014-05-16 DIAGNOSIS — C792 Secondary malignant neoplasm of skin: Secondary | ICD-10-CM

## 2014-05-16 DIAGNOSIS — K5903 Drug induced constipation: Secondary | ICD-10-CM

## 2014-05-16 DIAGNOSIS — J90 Pleural effusion, not elsewhere classified: Secondary | ICD-10-CM

## 2014-05-16 DIAGNOSIS — K5909 Other constipation: Secondary | ICD-10-CM

## 2014-05-16 DIAGNOSIS — C787 Secondary malignant neoplasm of liver and intrahepatic bile duct: Secondary | ICD-10-CM

## 2014-05-16 LAB — CBC WITH DIFFERENTIAL/PLATELET
BASO%: 0.5 % (ref 0.0–2.0)
Basophils Absolute: 0 10*3/uL (ref 0.0–0.1)
EOS ABS: 0.1 10*3/uL (ref 0.0–0.5)
EOS%: 1.5 % (ref 0.0–7.0)
HEMATOCRIT: 27.5 % — AB (ref 34.8–46.6)
HGB: 8.6 g/dL — ABNORMAL LOW (ref 11.6–15.9)
LYMPH%: 6.6 % — AB (ref 14.0–49.7)
MCH: 26.7 pg (ref 25.1–34.0)
MCHC: 31.1 g/dL — AB (ref 31.5–36.0)
MCV: 85.9 fL (ref 79.5–101.0)
MONO#: 0.3 10*3/uL (ref 0.1–0.9)
MONO%: 6 % (ref 0.0–14.0)
NEUT%: 85.4 % — AB (ref 38.4–76.8)
NEUTROS ABS: 4.5 10*3/uL (ref 1.5–6.5)
PLATELETS: 161 10*3/uL (ref 145–400)
RBC: 3.2 10*6/uL — ABNORMAL LOW (ref 3.70–5.45)
RDW: 19.2 % — ABNORMAL HIGH (ref 11.2–14.5)
WBC: 5.2 10*3/uL (ref 3.9–10.3)
lymph#: 0.3 10*3/uL — ABNORMAL LOW (ref 0.9–3.3)

## 2014-05-16 LAB — COMPREHENSIVE METABOLIC PANEL (CC13)
ALK PHOS: 188 U/L — AB (ref 40–150)
ALT: 15 U/L (ref 0–55)
ANION GAP: 8 meq/L (ref 3–11)
AST: 30 U/L (ref 5–34)
Albumin: 2.6 g/dL — ABNORMAL LOW (ref 3.5–5.0)
BILIRUBIN TOTAL: 0.63 mg/dL (ref 0.20–1.20)
BUN: 10.3 mg/dL (ref 7.0–26.0)
CO2: 27 meq/L (ref 22–29)
CREATININE: 0.6 mg/dL (ref 0.6–1.1)
Calcium: 9 mg/dL (ref 8.4–10.4)
Chloride: 101 mEq/L (ref 98–109)
GLUCOSE: 147 mg/dL — AB (ref 70–140)
Potassium: 3.7 mEq/L (ref 3.5–5.1)
Sodium: 137 mEq/L (ref 136–145)
Total Protein: 6 g/dL — ABNORMAL LOW (ref 6.4–8.3)

## 2014-05-16 NOTE — Progress Notes (Signed)
ID: Claire Mcbride OB: [birth year]  MR#: [id number]  OLM#:[id number]  PCP: Joseph Art, MD GYN:   SU:  OTHER MD: Jamal Collin. Fintel, Caren Horton Marshall NP, Gwenyth Bouillon Rae Roam, Jae Dire, Pablo Ledger  CHIEF COMPLAINT: Metastatic breast cancer with left pleural effusion and skin involvement  CURRENT THERAPY: Supportive care  BREAST CANCER HISTORY: From the initial intake note:  Lynel had routine screening mammography 06/09/2012 at [facility] in [place] showing a nodular density in the left breast associated with intermediate calcifications. Additional views 07/08/2012 confirmed a persistent spiculated density associated with pleomorphic calcifications. On 08/12/2012 the patient underwent ultrasound-guided biopsy of the left breast mass which measured approximately 8 mm at the 1145 position of the left breast as well as a prominent level I lymph node which showed diffuse cortical thickening. The pathology from this procedure (accession [id number] at the [facility] in [place]) showed in both the breast mass and lymph node and invasive ductal carcinoma, grade 3, estrogen receptor 10% positive at 1+, and progesterone receptor 15% positive, also at 1+ HER-2 was negative.  On 08/26/2012 the patient underwent bilateral breast MRI confirming an irregular enhancing lesion in the upper inner left breast measuring 3.6 cm in maximal dimensions. The biopsied level I node previously noted measured 2.4 cm. There were other adjacent irregular nodes which were also suspicious no other significant findings were noted. The patient was treated neoadjuvantly with tamoxifen started January 2014.  On 03/17/2013 the patient proceeded to right simple mastectomy and axillary lymph node sampling and left modified radical mastectomy under Dr. Jimmye Norman. The final pathology from this procedure ([id number]) showed, in the right  breast, no evidence of malignancy and 5 lymph nodes clear.  In the left breast, there was a 2.3 cm invasive ductal carcinoma, grade 3, involving 14 of 16 axillary lymph nodes sampled. 2 of these were micrometastatic metastases. There was no definite extracapsular extension. The pathologic stage accordingly was pT2 pN3 or stage IIIC.   The patient met with Dr. Lorene Dy 03/22/2013 to discuss systemic therapy, and his suggestion was that she continue on tamoxifen.   Her subsequent history is as detailed below.  INTERVAL HISTORY: Claire Mcbride returns today for follow up of her breast cancer accompanied by her father, Claire Mcbride. Since her last visit, she has had a pleurx drain inserted on her left side.  A home health nurse comes out to drain the pleurx 3 times per week and has gotten out about 450cc per catheterization lately. Lasondra was recently admitted to the hospital because of an infection to that site and required several days on IV antibiotics. She is currently on doxycyline PO. She is very anxious about the results of her scan.   REVIEW OF SYSTEMS: Claire Mcbride expresses depression and anxiety, but denies suicidal ideations. She is wearing 197mg/her transdermal fentanyl patches, and taking 823mhydromorphone around the clock. She doesn't always make it the entire 3hrs between doses of hydromorphone and finds her self needing to medicate soon. She has a bowel movement about every other day but it is usually hard and comes out in "little balls", even though she is taking over the counter medications. The rash to her left chest wall remains, but hasn't gotten worse. She has pain to her left side and back but also now feels it on her right side and this scares her. She complains of chills, insomnia, blurred vision, irregular heartbeats, ankle swelling, chest pain, shortness of breath  both at rest and while walking, dry cough, poor appetite, constipation, urinary stress incontinence, skin rash, forgetfulness, headaches,  weakness, and but no nausea or vomiting, no confusion, no dizziness or gait imbalance.   PAST MEDICAL HISTORY: Past Medical History  Diagnosis Date  . Breast cancer   . Bipolar disorder   . Psoriasis   . Type 2 diabetes mellitus   . Essential hypertension, benign   . Mixed hyperlipidemia   . GERD (gastroesophageal reflux disease)   . Allergy   . Anxiety   . Depression   . Hx of migraines     chronic  . H/O sick sinus syndrome   . Hx of colonoscopy 2012   The patient tells me she has undergone abdominal or laparoscopy to remove some scar tissue in has a right ovarian chronic pain possibly associated with that. She had an overdose of medication 2006. She had a left breast cyst removed in 2003. She tells me that she has bipolar disorder and schizoaffective disorder. She is followed at the [facility] by Mallie Mussel, a nurse practitioner in [place] [phone]).  PAST SURGICAL HISTORY: Past Surgical History  Procedure Laterality Date  . Breast surgery Bilateral   . Laparoscopy  1992   Status post bilateral mastectomies with left axillary lymph node dissection  FAMILY HISTORY Family History  Problem Relation Age of Onset  . Breast cancer Mother    The patient's parents are living, in their mid 56s. The patient has one brother and one sister. The patient's mother was diagnosed with in situ breast cancer at the age of 58. There are some cousins with a diagnosis of breast and other cancers, but no other first degree relative with cancer and no history of ovarian cancer in the family. The patient was tested for the BRCA gene and was found to be negative  GYNECOLOGIC HISTORY:   menarche age 70, first live birth age 20, the patient is GX P1. She stopped having periods in 2008.  SOCIAL HISTORY:  The patient is disabled secondary to her psychiatric history. She is divorced. Her mother is with her during this visit. The patient's  daughter Claire Mcbride lives in [place] where she is a housewife. The patient has 2 grandchildren. She attends a Geneticist, molecular of clot.    ADVANCED DIRECTIVES: The patient stated that she completed advanced directives at the time of her mastectomy. With further questioning, what she explained today is that she does not desire futile care or death prolonging care. She would agree to life support if it was instituted with the expectation that she would survive at with an adequate quality of life. The patient's mother was present during this discussion, which took place 06/01/2013   HEALTH MAINTENANCE: History  Substance Use Topics  . Smoking status: Former Smoker -- 0.50 packs/day for 30 years    Types: Cigarettes    Quit date: 06/12/2013  . Smokeless tobacco: Not on file  . Alcohol Use: No   she smokes approximately 1/2 packs of cigarettes daily, but is planning to switch to an electronic non-nicotine cigarettes. The patient was strongly advised to quit smoking altogether and was offered participation in local smoking cessation programs.   Colonoscopy:-  PAP: 2012  Bone density:  Lipid panel:  Allergies  Allergen Reactions  . Bee Venom Anaphylaxis  . Penicillins Anaphylaxis and Other (See Comments)    Chest pain  . Relpax [Eletriptan] Anaphylaxis  . Bactrim [Sulfamethoxazole-Trimethoprim] Other (See  Comments)    Chest pain  . Diclofenac Other (See Comments)    Chest pain  . Levofloxacin Other (See Comments)    Chest pain  . Lisinopril Diarrhea and Cough  . Losartan Diarrhea and Cough  . Morphine Other (See Comments)    Pt c/o chest pain with morphine  . Morphine And Related Other (See Comments)    Chest pain (Patient takes po hydromorphone at home.)  . Other Other (See Comments)    IV electrolyte solution with perservates; Ceftin, chest pain; cipro chest pain- pt unsure which antibiotic caused the chest pain IV electrolyte solution with perservates - turns arm red    . Pantoprazole Diarrhea  . Prednisone Other (See Comments)    Chest pain  . Protonix [Pantoprazole Sodium] Diarrhea  . Zantac [Ranitidine Hcl] Diarrhea  . Lidocaine Palpitations    Heart beats fast    Current Outpatient Prescriptions  Medication Sig Dispense Refill  . atenolol (TENORMIN) 25 MG tablet Take 25 mg by mouth every morning.       Marland Kitchen buPROPion (WELLBUTRIN XL) 300 MG 24 hr tablet Take 300 mg by mouth every morning.       . clonazePAM (KLONOPIN) 2 MG tablet Take 2-4 mg by mouth 3 (three) times daily. The patient takes 2 mg (1 tablet) in the morning and at lunch - and anywhere from 2-4 mg (1-2 tablets) at bedtime - however the patient states this is usually 1 tablet (2 mg)      . docusate sodium (COLACE) 100 MG capsule Take 100 mg by mouth daily as needed for mild constipation.      Marland Kitchen doxycycline (VIBRA-TABS) 100 MG tablet Take 1 tablet (100 mg total) by mouth 2 (two) times daily.  20 tablet  0  . ergocalciferol (VITAMIN D2) 50000 UNITS capsule Take 50,000 Units by mouth every 14 (fourteen) days.       . fentaNYL (DURAGESIC - DOSED MCG/HR) 100 MCG/HR Place 1 patch (100 mcg total) onto the skin every 3 (three) days.  10 patch  0  . fentaNYL (DURAGESIC - DOSED MCG/HR) 50 MCG/HR Place 1 patch (50 mcg total) onto the skin every 3 (three) days.  10 patch  0  . furosemide (LASIX) 20 MG tablet Take 1 tablet (20 mg total) by mouth every other day.  20 tablet  0  . glimepiride (AMARYL) 4 MG tablet Take 4 mg by mouth 2 (two) times daily.      Marland Kitchen HYDROmorphone (DILAUDID) 8 MG tablet Take 8 mg by mouth every 3 (three) hours as needed for moderate pain or severe pain.       Javier Docker Oil (MAXIMUM RED KRILL PO) Take 1,500 mg by mouth every morning.      . lamoTRIgine (LAMICTAL) 100 MG tablet Take 100 mg by mouth 2 (two) times daily.      . lansoprazole (PREVACID) 30 MG capsule Take 30 mg by mouth 2 (two) times daily.       . naproxen (NAPROSYN) 250 MG tablet Take 250 mg by mouth 2 (two) times daily  as needed (For pain.).      Marland Kitchen potassium chloride SA (K-DUR,KLOR-CON) 20 MEQ tablet Take 1 tablet (20 mEq total) by mouth every other day.  20 tablet  0  . albuterol (PROVENTIL HFA;VENTOLIN HFA) 108 (90 BASE) MCG/ACT inhaler Inhale 2 puffs into the lungs every 4 (four) hours as needed for wheezing or shortness of breath.       . EPINEPHrine (EPIPEN)  0.3 mg/0.3 mL SOAJ injection Inject 0.3 mg into the muscle as needed (For anaphyaxis.).       Marland Kitchen ipratropium-albuterol (DUONEB) 0.5-2.5 (3) MG/3ML SOLN Take 3 mLs by nebulization 4 (four) times daily.        No current facility-administered medications for this visit.    OBJECTIVE: Middle-aged white woman who appears stated age 53 Vitals:   05/16/14 1100  BP: 101/47  Pulse: 93  Temp: 98.4 F (36.9 C)  Resp: 18     Body mass index is 33.81 kg/(m^2).    ECOG FS:2 - Symptomatic, <50% confined to bed  HEENT: sclerae anicteric, conjunctivae pink, oropharynx clear. No thrush or mucositis.   Lungs: left lung field dull throughout, no rales, wheezes, or rhonci  Heart: regular rate and rhythm  Abdomen: round, soft, non tender, positive bowel sounds  Musculoskeletal: No focal spinal tenderness, bilateral lower extremity edema Neuro: non focal, well oriented, anxious affect  Breasts: status post bilateral mastectomies Skin: scattered erythematous maculopapular rash anterior and posterior chest region with overlying dry/flaky skin   Pictures of rash from 04/28/14 :      LAB RESULTS: I No results found for this basename: SPEP,  UPEP,   kappa and lambda light chains   Lab Results  Component Value Date   WBC 5.2 05/16/2014   HGB 8.6* 05/16/2014   HCT 27.5* 05/16/2014   MCV 85.9 05/16/2014   PLT 161 05/16/2014      Chemistry      Component Value Date/Time   NA 137 05/16/2014 1035   NA 139 05/09/2014 0915   K 3.7 05/16/2014 1035   K 4.1 05/09/2014 0915   CL 101 05/09/2014 0915   CO2 27 05/16/2014 1035   CO2 25 05/09/2014 0915   BUN 10.3  05/16/2014 1035   BUN 8 05/09/2014 0915   CREATININE 0.6 05/16/2014 1035   CREATININE 0.48* 05/09/2014 0915      Component Value Date/Time   CALCIUM 9.0 05/16/2014 1035   CALCIUM 8.9 05/09/2014 0915   ALKPHOS 188* 05/16/2014 1035   ALKPHOS 180* 05/07/2014 0455   AST 30 05/16/2014 1035   AST 25 05/07/2014 0455   ALT 15 05/16/2014 1035   ALT 14 05/07/2014 0455   BILITOT 0.63 05/16/2014 1035   BILITOT 0.5 05/07/2014 0455       No results found for this basename: LABCA2    No components found with this basename: LABCA125    No results found for this basename: INR,  in the last 168 hours  Urinalysis No results found for this basename: colorurine,  appearanceur,  labspec,  phurine,  glucoseu,  hgbur,  bilirubinur,  ketonesur,  proteinur,  urobilinogen,  nitrite,  leukocytesur    STUDIES: Dg Chest 1 View  05/08/2014   CLINICAL DATA:  Pleural effusion  EXAM: CHEST - 1 VIEW  COMPARISON:  Yesterday  FINDINGS: Left pleural effusion has improved but remains large. Associated volume loss and left lung. Stable right internal jugular Port-A-Cath. Tiny right effusion. Right lung clear.  IMPRESSION: Improved left pleural effusion.  A large pleural effusion persists.   Electronically Signed   By: Maryclare Bean M.D.   On: 05/08/2014 10:24   Ct Chest W Contrast  05/07/2014   CLINICAL DATA:  Infection in region of a left chest catheter.  EXAM: CT CHEST WITH CONTRAST  TECHNIQUE: Multidetector CT imaging of the chest was performed during intravenous contrast administration.  CONTRAST:  71m OMNIPAQUE IOHEXOL 300 MG/ML  SOLN  COMPARISON:  Chest radiograph, 07/06/2013.  FINDINGS: There is a Port-A-Cath with its tip just above the caval atrial junction. There is no associated inflammation or fluid collection.  Along the left chest wall, bordering the pectoralis major, there is a fluid collection measuring 6 cm x 2.6 cm x 5.5 cm there is adjacent inflammatory stranding. Overlying skin is thickened. Patient has changes  consistent with a left mastectomy. This collection could reflect a postoperative seroma.  A left chest tube enters the lateral inferior left hemi thorax extending along the posterior aspect of the thoracic cavity to have its tip near the left apex. There is a large left pleural effusion. The left lower lobe is collapsed. There are there is partial atelectasis of the left upper lobe.  There is fluid attenuation in the subcutaneous soft tissues of the lateral inferior hemi thorax and upper abdomen, adjacent to the chest tube insertion site on the left. No defined collection is seen. This is incompletely imaged. It is likely edema.  There is no evidence of pulmonary edema. There are several areas of coarse reticular type opacity in the upper lobes. Mild dependent subsegmental atelectasis is noted in the right lower lobe. No discrete pulmonary mass or nodule is seen.  Heart is normal in size. No mediastinal or hilar masses. A mildly enlarged right precarinal lymph node is noted measuring 12 mm in short axis.  Below the diaphragm there is no significant abnormality.  No osteoblastic or osteolytic lesions.  IMPRESSION: 1. Fluid collection along the lateral margin of the left pectoralis muscle. This could reflect an abscess. It may reflect a postsurgical seroma. 2. No evidence of a fluid collection or inflammation associated with the right anterior chest wall Port-A-Cath. 3. Large left pleural effusion. Left lower lobe atelectasis and partial left upper lobe atelectasis. This is despite the presence of a well-positioned left-sided chest tube. 4. No pulmonary edema. No convincing metastatic disease to the lungs. A central obstructing lesion involving the left lower lobe bronchi should be considered in the proper clinical setting and may warrant bronchoscopy.   Electronically Signed   By: Lajean Manes M.D.   On: 05/07/2014 07:48   Ir Guided Niel Hummer W Catheter Placement  04/29/2014   CLINICAL DATA:  Breast carcinoma with  large malignant left pleural effusion.  EXAM: INSERTION OF TUNNELED LEFT SIDED PLEURAL DRAINAGE CATHETER  COMPARISON:  None.  MEDICATIONS: VANCOMYCIN 1 G; Antibiotic was administered in an appropriate time interval for the procedure.  ANESTHESIA/SEDATION: Intravenous Fentanyl and Versed were administered as conscious sedation during continuous cardiorespiratory monitoring by the radiology RN, with a total moderate sedation time of eighteen minutes.  FLUOROSCOPY TIME:  FLUOROSCOPY TIME 6seconds  COMPLICATIONS: None immediate  PROCEDURE: The procedure, risks, benefits, and alternatives were explained to the patient and father, who wish to proceed with the placement of this permanent pleural catheter as the patient is seeking palliative care. The patient understand and consent to the procedure.  Survey ultrasound of the left hemi thorax was performed and an appropriate site uninvolved by overt macroscopic disease was selected.  The left lateral chest and upper abdomen were prepped with Chlorhexidine in a sterile fashion, and a sterile drape was applied covering the operative field. A sterile gown and sterile gloves were used for the procedure. Initial ultrasound scanning and fluoroscopic imaging demonstrates a recurrent moderate to large pleural effusion.  Under ultrasound guidance, the left inferior lateral pleural space was accessed with a sheath needle after the overlying soft tissues were anesthetized with 1%  nesacaine. An Amplatz super stiff wire was then advanced under fluoroscopy into the pleural space.  A 15.5 French tunneled Pleur-X catheter was tunneled from an incision within the left upper abdominal quadrant to the access site. The pleural access site was serially dilated under fluoroscopy, ultimately allowing placement of a peel-away sheath. The catheter was advanced through the peel-away sheath. The sheath was then removed. Final catheter positioning was confirmed with a fluoroscopic radiographic image.   The access incision was closed with Dermabond . A Prolene retention suture was applied at the catheter exit site. Large volume thoracentesis was performed through the new catheter utilizing provided bulb vacuum assisted drainage bag. The patient tolerated the above procedure well without immediate postprocedural complication.  FINDINGS: Preprocedural ultrasound scanning demonstrates a large left sided pleural effusion.  After ultrasound and fluoroscopic guided placement, the catheter is directed cephalad towards the apex.  Following catheter placement, approximately 1600 cc of blood tinged pleural fluid was removed.  IMPRESSION: Successful placement of permanent, tunneled left PleurX drainage catheter via lateral approach. 1.6 liters of blood-tinged pleural fluid was removed after catheter placement.   Electronically Signed   By: Arne Cleveland M.D.   On: 04/29/2014 17:28   Nm Pet Image Restag (ps) Skull Base To Thigh  05/13/2014   CLINICAL DATA:  Subsequent treatment strategy for breast cancer.  EXAM: NUCLEAR MEDICINE PET SKULL BASE TO THIGH  TECHNIQUE: 9.8 mCi F-18 FDG was injected intravenously. Full-ring PET imaging was performed from the skull base to thigh after the radiotracer. CT data was obtained and used for attenuation correction and anatomic localization.  FASTING BLOOD GLUCOSE:  Value: 74 mg/dl  COMPARISON:  12/21/2013  FINDINGS: NECK  Interval development of bilateral supraclavicular adenopathy. Index left supraclavicular lymph node measures 9 mm and has an SUV max equal to 7.9.  CHEST  Hypermetabolic tumor within the left chest wall is again identified. The SUV max within this area is equal to 7.9. This is compared with 4.5 previously. Persistent hypermetabolic tumor is identified within left axillary lymph nodes. There are new hypermetabolic right axillary lymph nodes which have an SUV max equal to 5.  Moderate left pleural effusion is new from the previous PET-CT. There is diffuse malignant  range FDG uptake involving the left lung pleura. In the absence of interval pleurodesis findings are concerning for trans pleural spread of tumor within the left hemi thorax.  Interval development of extensive mediastinal and bilateral hilar lymph node metastasis. Hypermetabolic right paratracheal lymph node measures 1.4 cm and has an SUV max equal to 7.2.  ABDOMEN/PELVIS  New hypermetabolic liver metastasis. Index lesion involving the posterior right hepatic lobe measures approximate 5.7 cm and has an SUV max equal to 9.8.  Interval development of hypermetabolic adrenal metastasis. Index lesion in the left adrenal gland measures 2.5 cm and has an SUV max equal to 11.1. Hypermetabolic abdominal adenopathy is identified and appears new from previous exam. Index lymph node in the gastrohepatic ligament region has an SUV max equal to 4.7. New hypermetabolic lymph node within the left inguinal region is identified. This measures 1.5 cm short axis and has an SUV max equal to 7.7. No abnormal hypermetabolic activity within the liver, pancreas, adrenal glands, or spleen. No hypermetabolic lymph nodes in the abdomen or pelvis.  SKELETON  New, multi focal hypermetabolic bone metastasis. Index lesion involving the T8 vertebra has an SUV max equal to 12.1. Index lesion involving the superior endplate of L3 has an SUV max equal to 10.6.  IMPRESSION: 1. Marked interval progression of disease. 2. New bilateral supraclavicular, right axillary, mediastinal, and bilateral hilar metastatic adenopathy 3. Suspect trans pleural spread of tumor within the left hemi thorax. 4. New liver and bilateral adrenal gland metastasis. 5. New upper abdominal and left inguinal lymph node metastasis 6. New multifocal bone metastasis.   Electronically Signed   By: Kerby Moors M.D.   On: 05/13/2014 15:29    ASSESSMENT: 53 y.o. BRCA negative [place], [place] woman status post left breast biopsy 08/20/2012 for a clinical T2 N1, stage IIB invasive  ductal carcinoma, grade 3, estrogen receptor positive at 10%, progesterone receptor positive at 15%, with no HER-2 amplification  (1) treated neoadjuvantly with tamoxifen started January 2014  (2) status post left modified radical mastectomy 03/17/2013 for a pT2 pN3, stage IIIC invasive ductal carcinoma, grade 3 [concurrent right simple mastectomy and axillary lymph node sampling on the right was benign]  (3) adjuvant therapy consisted of doxorubicin and cyclophosphamide in dose dense fashion x4 followed by weekly paclitaxel x11 completed February 2015  (4) skin biopsy 11/23/2013 showed metastatic breast carcinoma estrogen and progesterone receptor negative, HER-2 equivocal by immunohistochemistry but negative by FISH  (5) on "oral CMF" (cyclophosphamide, methotrexate, fluorouracil) started February 2015, with documented progression after 2 cycles.  (6) received weekly carboplatin (AUC 2) with concurrent radiation between 03/02/2014 and 03/11/2014  (7) laparoscopic cholecystectomy or effusion, status post initial thoracentesis 03/15/2014, with malignant cytology, again estrogen, progesterone, and HER-2 negative.   PLAN: Camyla is in increasing pain. She was told that its acceptable to take the hydromorphone more often than q3hrs if necessary. While her bowel movements are hard, she is still having them regularly. She will continue her OTC routine to avoid constipation. She will continue to have her pleurx drained at least 3x a week to keep breathing comfortably.  We spent about 40 minutes reviewing Fable's situation. Her PET scan returned with evidence of extensive progression of disease. There is disease noted to several lymph nodes, the liver, bilateral adrenal glands, and multifocal bone metastasis. We expect that Nannie has about a 50% chance of surviving the next 6 months and this news was given to her. We discussed Colbie's options as far as our next steps. She was explained the extent of   "comfort care/support" and hospice's role in her care. She knows her pain and uncomfortable symptoms would be addressed and that Dr. Jana Hakim would still her oncologist and available to her for questions and concerns. We also talked about the possibility of starting doxil, and she knows that it has a very poor chance (<20%) of controlling her disease.   Murrel was tearful and needed reassurance that no matter what, her comfort was a priority. She is anxious about the idea of suffering. She asked about a trial drug at [facility], but admits the drive would likely be too much for her to handle . Her father expressed that he wishes she would try the doxil. Zanovia understands her options and agrees to think them over. She knows her stage IV disease has progressed and is incurable. We ask that Ziyan call sometime next week with her decision on how to proceed with her situation and that she can call sooner if any issues arise. Otherwise, her next visit with [place] will be 4 weeks.  Marcelino Duster, NP   05/16/2014 8:40 PM

## 2014-05-16 NOTE — Progress Notes (Signed)
This RN spoke with pt post message left by her father - Percell Miller - who accompanied her to appointment today.  Per call Thailyn states concern that didn't get addressed relating to the swelling in her legs and ankles.  Quentina feels above occurred post starting the doxycycline and is asking if MD could change her antibiotic to something else or " do I need to take it all ".  This RN discussed above with pt including best to not stop the antibiotic due to risk of developing an infection.  Plan is pt will continue to elevate legs and monitor salt intake.  Stephan will continue to maintain good fluid intake.  This RN will discuss possible change in antibiotic but noted pt has reactions to several antibiotics.  Return call number for pt is [phone].

## 2014-05-16 NOTE — Telephone Encounter (Signed)
per pof to sch pt appt-sch & gave pt copy of sch °

## 2014-05-17 ENCOUNTER — Telehealth: Payer: Self-pay | Admitting: *Deleted

## 2014-05-17 NOTE — Telephone Encounter (Signed)
This RN spoke with pt per her concern with swelling in lower extremities being caused by the doxycycline.  Per MD review- recommendation given for pt to hold doxycycline and to monitor symptoms.  This RN called pt's home phone and spoke with her fatherPercell Miller.

## 2014-05-17 NOTE — Addendum Note (Signed)
Addended by: Chauncey Cruel on: 05/17/2014 08:38 AM   Modules accepted: Level of Service

## 2014-05-17 NOTE — Progress Notes (Signed)
  ADDENDUM: I met with the patienr, her father and our APP and we discussed results of her restaging scans. They show progression and liver involvement. We discussed whether at this point ti would be best to switch to a comfort/palliative care approach or switch to a different type of chemotherapy. We specifically discussed Doxil. We discussed the details of a best supportive care/ Hospice approach. ASfter much discussion Mckaylie could not decide which approach would be best for her. My recommendation was for best supportive care and Hospice referral. She will think about this further and call us back with her decision. She is scheduled for an echo in case she decides to try the Doxil.  I personally saw this patient and performed a substantive portion of this encounter with the listed APP documented above.   Chauncey Cruel, MD

## 2014-05-23 ENCOUNTER — Telehealth: Payer: Self-pay

## 2014-05-23 NOTE — Telephone Encounter (Signed)
Received call to get notes on office visit and orders for oxygen.Informed patient seen in [place].Gave them Phone number [phone] and to follow prompts to speak to rad/onc nurse Santiago Glad.

## 2014-05-24 ENCOUNTER — Telehealth: Payer: Self-pay | Admitting: *Deleted

## 2014-05-24 NOTE — Telephone Encounter (Signed)
This RN spoke with pt per message left by her father stating " her pain is not well controlled".  Per discussion Claire Mcbride states she is having to use the diluadid " at least every 2 hours". " the pain keeps me from sleeping more then 2 hours and then I need the pain med before I am supposed to take it "  Per further inquiry pt states she is wearing 163mcg fentanyl.  Plan per review is pt will increase her fentanyl to 255mcg ( 1 of the 100 and 2 or the 50) she will continue use of diluadid for breakthrough pain.  Bowel concerns discussed with Claire Mcbride stated she is passing hard small stool " I am using ex lax all day and it doesn't seem to be helping "  Pt will use sennakot bid instead for better bowel prophylaxis.  Claire Mcbride also stated " do they have to drain my lung every other day because when they do it hurts really bad in my neck and jaw "  Per above Claire Mcbride will take an extra diluadid prior to procedure.  Claire Mcbride will call tomorrow with update on pain control.

## 2014-06-06 ENCOUNTER — Other Ambulatory Visit: Payer: Self-pay | Admitting: *Deleted

## 2014-06-06 ENCOUNTER — Telehealth: Payer: Self-pay | Admitting: *Deleted

## 2014-06-06 NOTE — Telephone Encounter (Signed)
Received call from patient's father in reference to patient is now under hospice care. POF sent to cancel appts.

## 2014-06-08 ENCOUNTER — Telehealth: Payer: Self-pay | Admitting: Oncology

## 2014-06-08 NOTE — Telephone Encounter (Signed)
per Diane b to CX ALL appt pt in [facility]

## 2014-06-13 ENCOUNTER — Other Ambulatory Visit: Payer: Medicare PPO

## 2014-06-13 ENCOUNTER — Ambulatory Visit: Payer: Medicare PPO | Admitting: Nurse Practitioner

## 2014-06-23 DEATH — deceased

## 2014-07-28 ENCOUNTER — Other Ambulatory Visit: Payer: Self-pay | Admitting: Oncology

## 2014-08-07 ENCOUNTER — Other Ambulatory Visit: Payer: Self-pay | Admitting: Oncology

## 2014-11-11 NOTE — Telephone Encounter (Signed)
none

## 2014-12-19 IMAGING — US IR FLUORO GUIDE CV LINE*R*
1 series · 1 of 1 positions shown · non-contrast
Comparison: none

CLINICAL DATA: Breast carcinoma and need for porta cath to begin
chemotherapy.

[Series 1: ir us guide vasc access*right* · 1 of 1 slices shown]
[im 1/1]
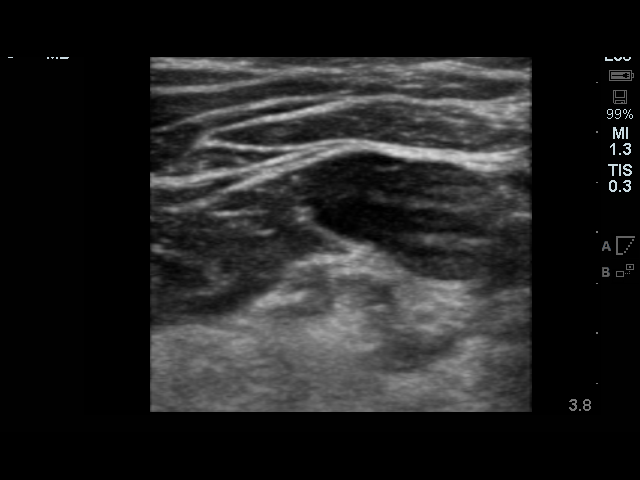

[1 of 1 positions shown; findings below may reference images not displayed]

EXAM:
IMPLANTED PORT A CATH PLACEMENT WITH ULTRASOUND AND FLUOROSCOPIC
GUIDANCE

ANESTHESIA/SEDATION:
4.0 Mg IV Versed; 200 mcg IV Fentanyl

Total Moderate Sedation Time:  60 minutes

Additional Medications: 1 g IV vancomycin. Vancomycin was given
within two hours of incision. Vancomycin was given due to an
antibiotic allergy.

FLUOROSCOPY TIME:  12 seconds.

PROCEDURE:
The procedure, risks, benefits, and alternatives were explained to
the patient. Questions regarding the procedure were encouraged and
answered. The patient understands and consents to the procedure.

The right neck and chest were prepped with chlorhexidine in a
sterile fashion, and a sterile drape was applied covering the
operative field. Maximum barrier sterile technique with sterile
gowns and gloves were used for the procedure. Local anesthesia was
provided with 1% lidocaine and lidocaine with epinephrine.

Ultrasound was used to confirm patency of the right internal jugular
vein. After creating a small venotomy incision, a 21 gauge needle
was advanced into the right internal jugular vein under direct,
real-time ultrasound guidance. Ultrasound image documentation was
performed. After securing guidewire access, an 8 Fr dilator was
placed. A J-wire was kinked to measure appropriate catheter length.

A subcutaneous port pocket was then created along the upper chest
wall utilizing sharp and blunt dissection. Portable cautery was
utilized. The pocket was irrigated with sterile saline.

A single lumen power injectable port was chosen for placement. The 8
Fr catheter was tunneled from the port pocket site to the venotomy
incision. The port was placed in the pocket. External catheter was
trimmed to appropriate length based on guidewire measurement.

At the venotomy, an 8 Fr peel-away sheath was placed over a
guidewire. The catheter was then placed through the sheath and the
sheath removed. Final catheter positioning was confirmed and
documented with a fluoroscopic spot image. The port was accessed
with a needle and aspirated and flushed with heparinized saline. The
needle was removed.

The venotomy and port pocket incisions were closed with subcutaneous
3-0 Monocryl and subcuticular 4-0 Vicryl. Dermabond was applied to
both incisions.

Complications: None. No pneumothorax.
FINDINGS: After catheter placement, the tip lies at the cavoatrial junction.
The catheter aspirates normally and is ready for immediate use.
IMPRESSION: Placement of single lumen port a cath via right internal jugular
vein. The catheter tip lies at the cavoatrial junction. A power
injectable port a cath was placed and is ready for immediate use.

## 2015-11-16 IMAGING — CR DG CHEST 1V
1 series · 1 of 1 positions shown · non-contrast
Comparison: Yesterday

CLINICAL DATA: Pleural effusion

EXAM:
CHEST - 1 VIEW

[view not recorded]
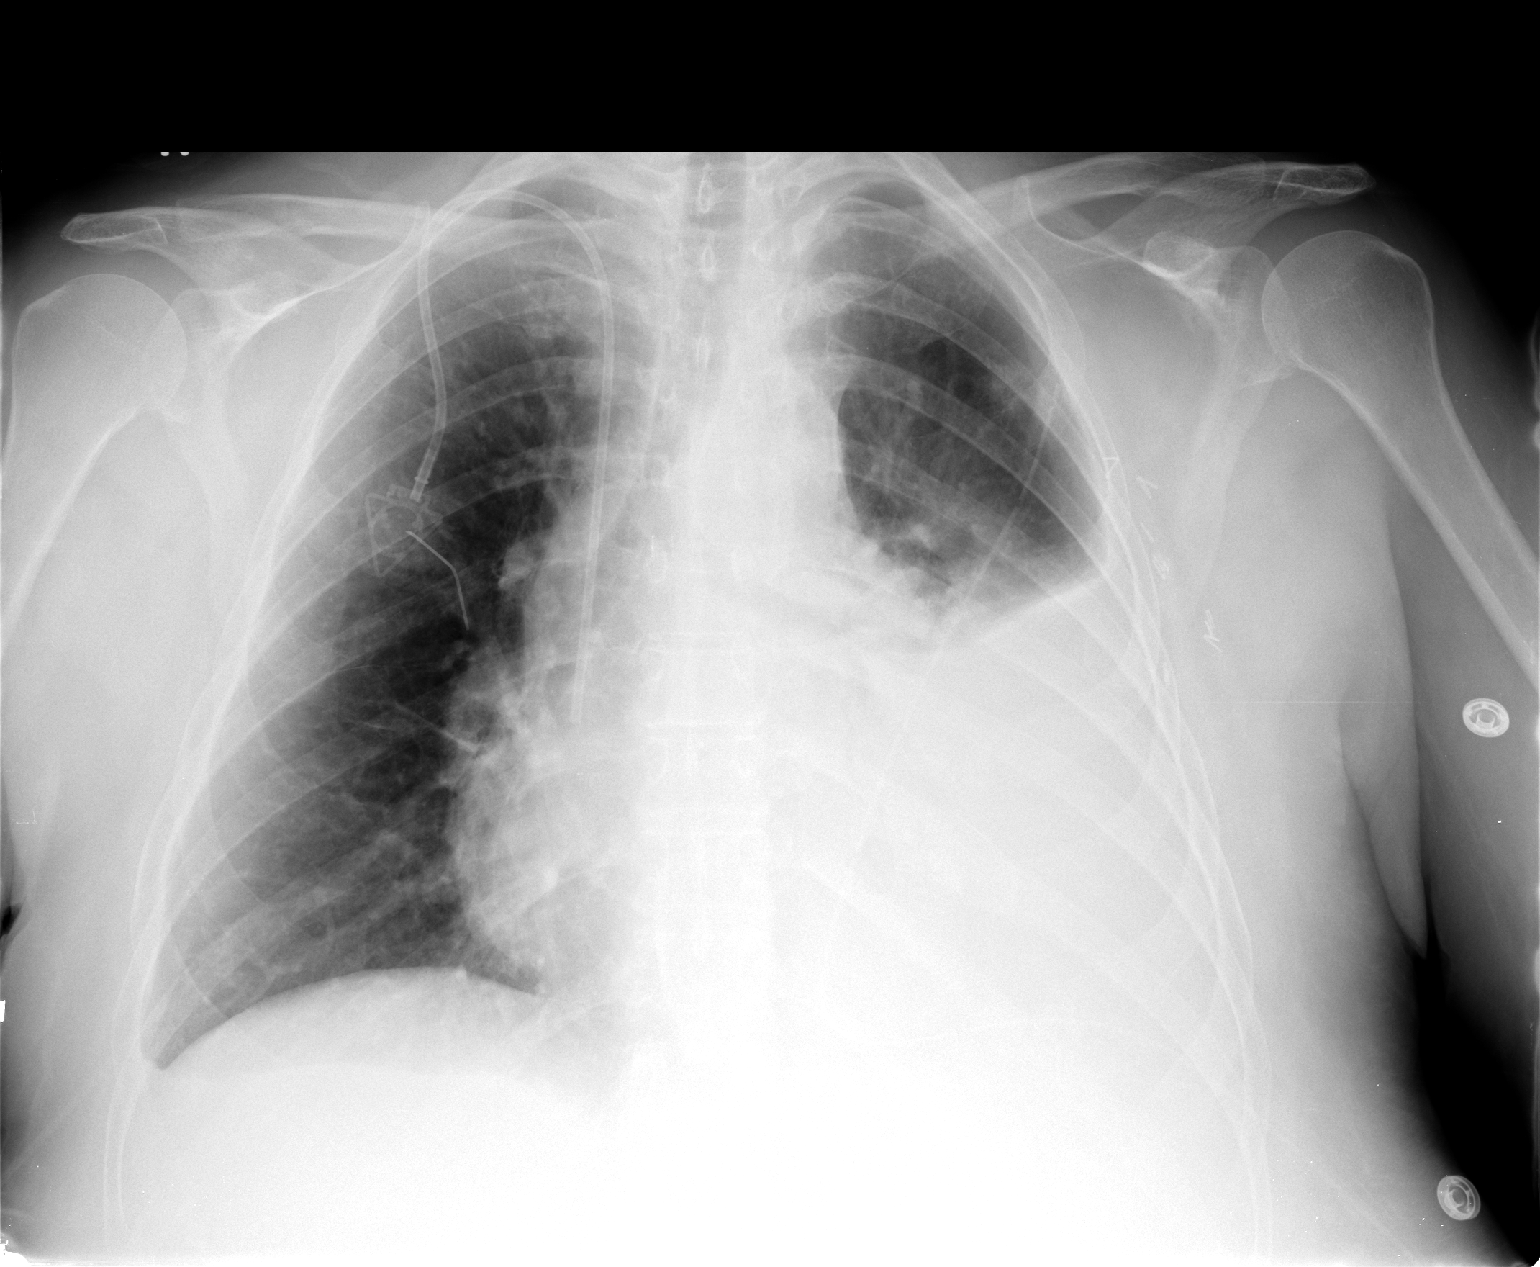

[1 of 1 positions shown; findings below may reference images not displayed]

FINDINGS: Left pleural effusion has improved but remains large. Associated
volume loss and left lung. Stable right internal jugular
Port-A-Cath. Tiny right effusion. Right lung clear.
IMPRESSION: Improved left pleural effusion.  A large pleural effusion persists.

## 2015-11-21 IMAGING — CT NM PET TUM IMG RESTAG (PS) SKULL BASE T - THIGH
7 of 8 series · 19 of 25 positions shown · non-contrast
Comparison: 12/21/2013

CLINICAL DATA: Subsequent treatment strategy for breast cancer.

EXAM:
NUCLEAR MEDICINE PET SKULL BASE TO THIGH
TECHNIQUE: 9.8 mCi F-18 FDG was injected intravenously. Full-ring PET imaging
was performed from the skull base to thigh after the radiotracer. CT
data was obtained and used for attenuation correction and anatomic
localization.
FASTING BLOOD GLUCOSE:  Value: 74 mg/dl

[Series 3: pet sk_thigh ac · axial · 5.0mm · 4.07mm/px · z∈[-454,+482]mm · 4 of 235 slices shown]
[im 1/235]
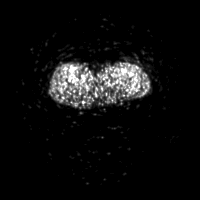
[im 59/235]
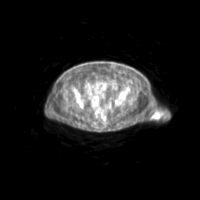
[im 176/235]
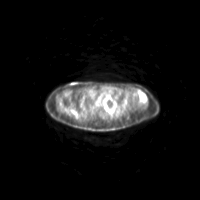
[im 235/235]
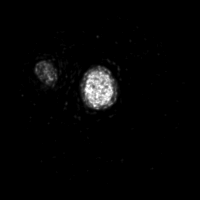

[Series 4: ct sk_thigh 5.0 b31f · axial · 5.0mm · 0.98mm/px · z∈[-454,+482]mm · 4 of 235 slices shown]
[im 1/235]
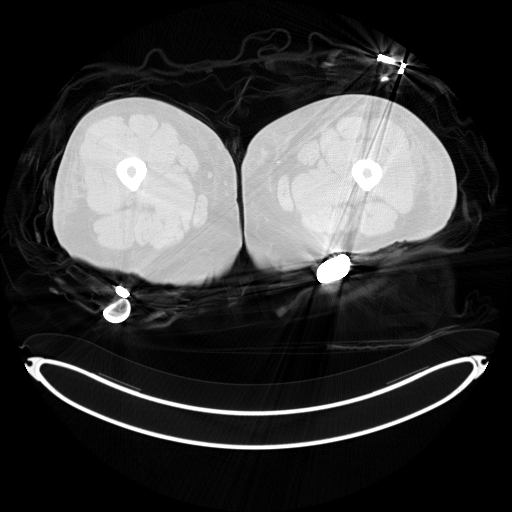
[im 118/235]
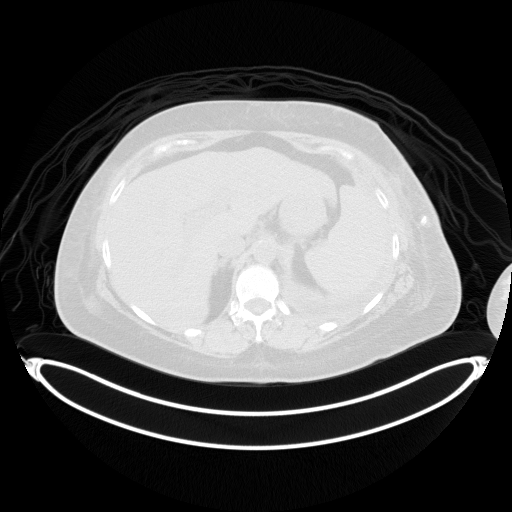
[im 176/235]
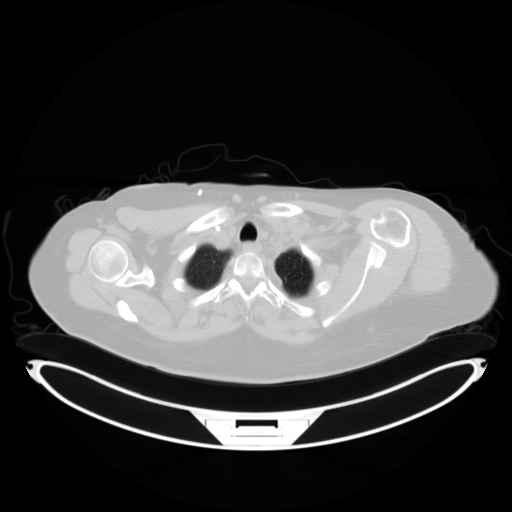
[im 235/235  brain]
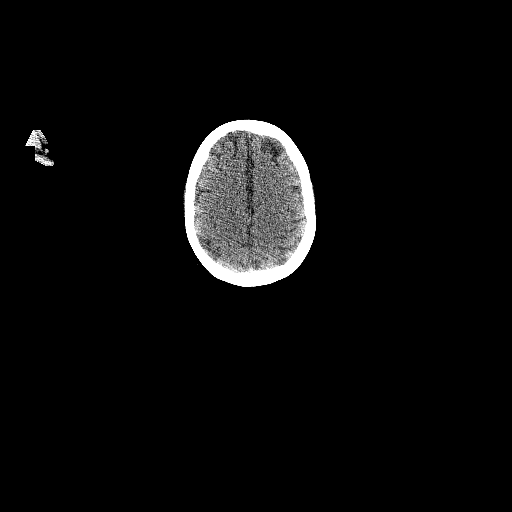

[Series 8: pet sk_thigh nac · axial · 5.0mm · 4.07mm/px · z∈[-454,+482]mm · 4 of 235 slices shown]
[im 1/235]
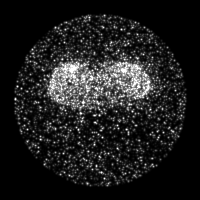
[im 59/235]
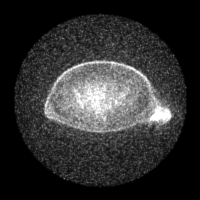
[im 118/235]
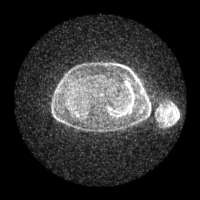
[im 235/235]
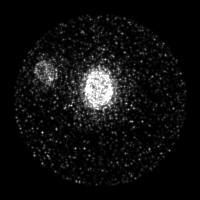

[Series 603: mip collection<mip range> · coronal · 1.94mm/px · 1 of 32 slices shown]
[im 1/32]
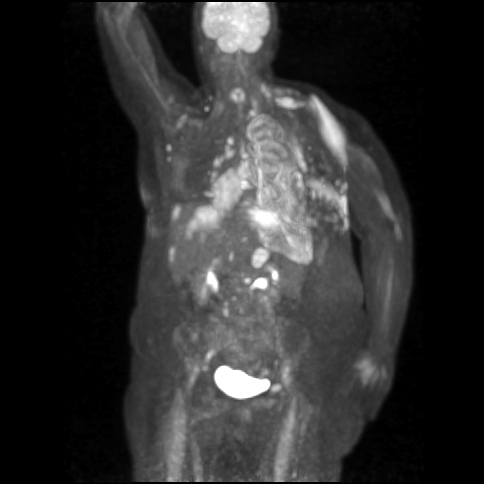

[Series 604: range-ct sk_thigh 5.0 (id)<alpha range> · 1 of 68 slices shown (1 of 2)]
[im 1/68]
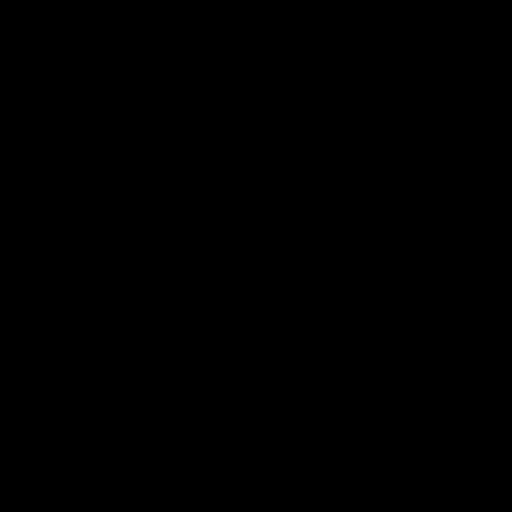

[Series 605: range-ct sk_thigh 5.0 (id)<alpha range> · 4 of 227 slices shown (2 of 2)]
[im 1/227]
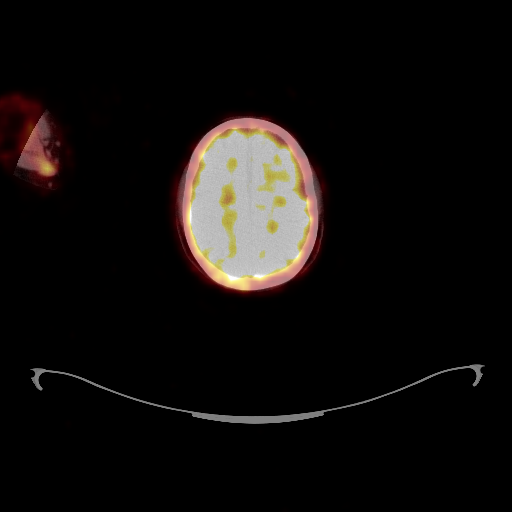
[im 57/227]
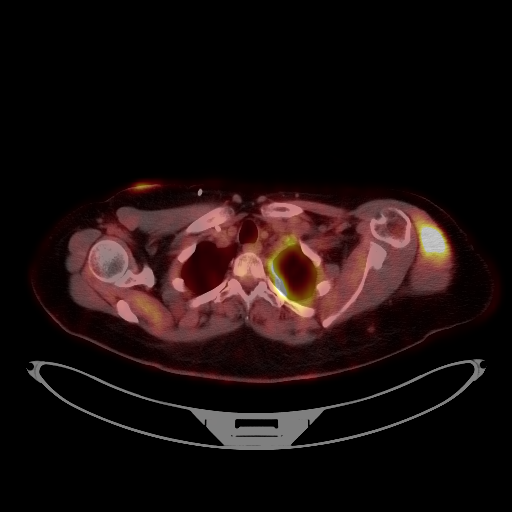
[im 114/227]
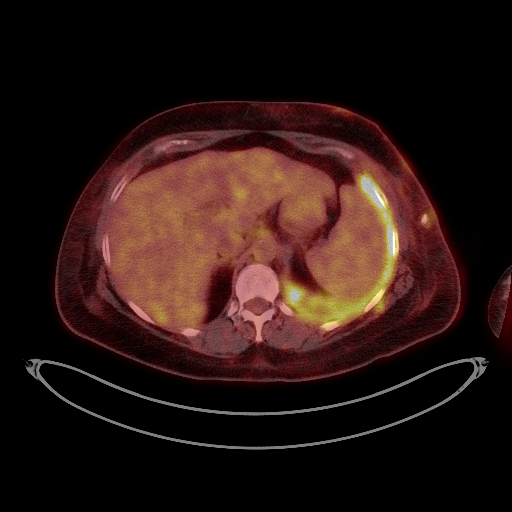
[im 227/227]
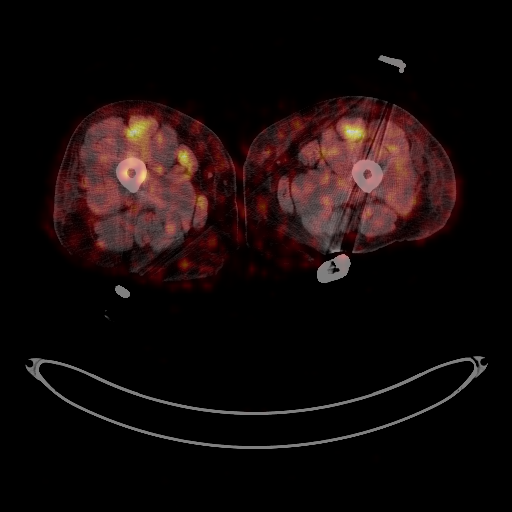

[Series 1187: results mm oncology reading · 0.84mm/px · 1 of 10 slices shown]
[im 1/10]
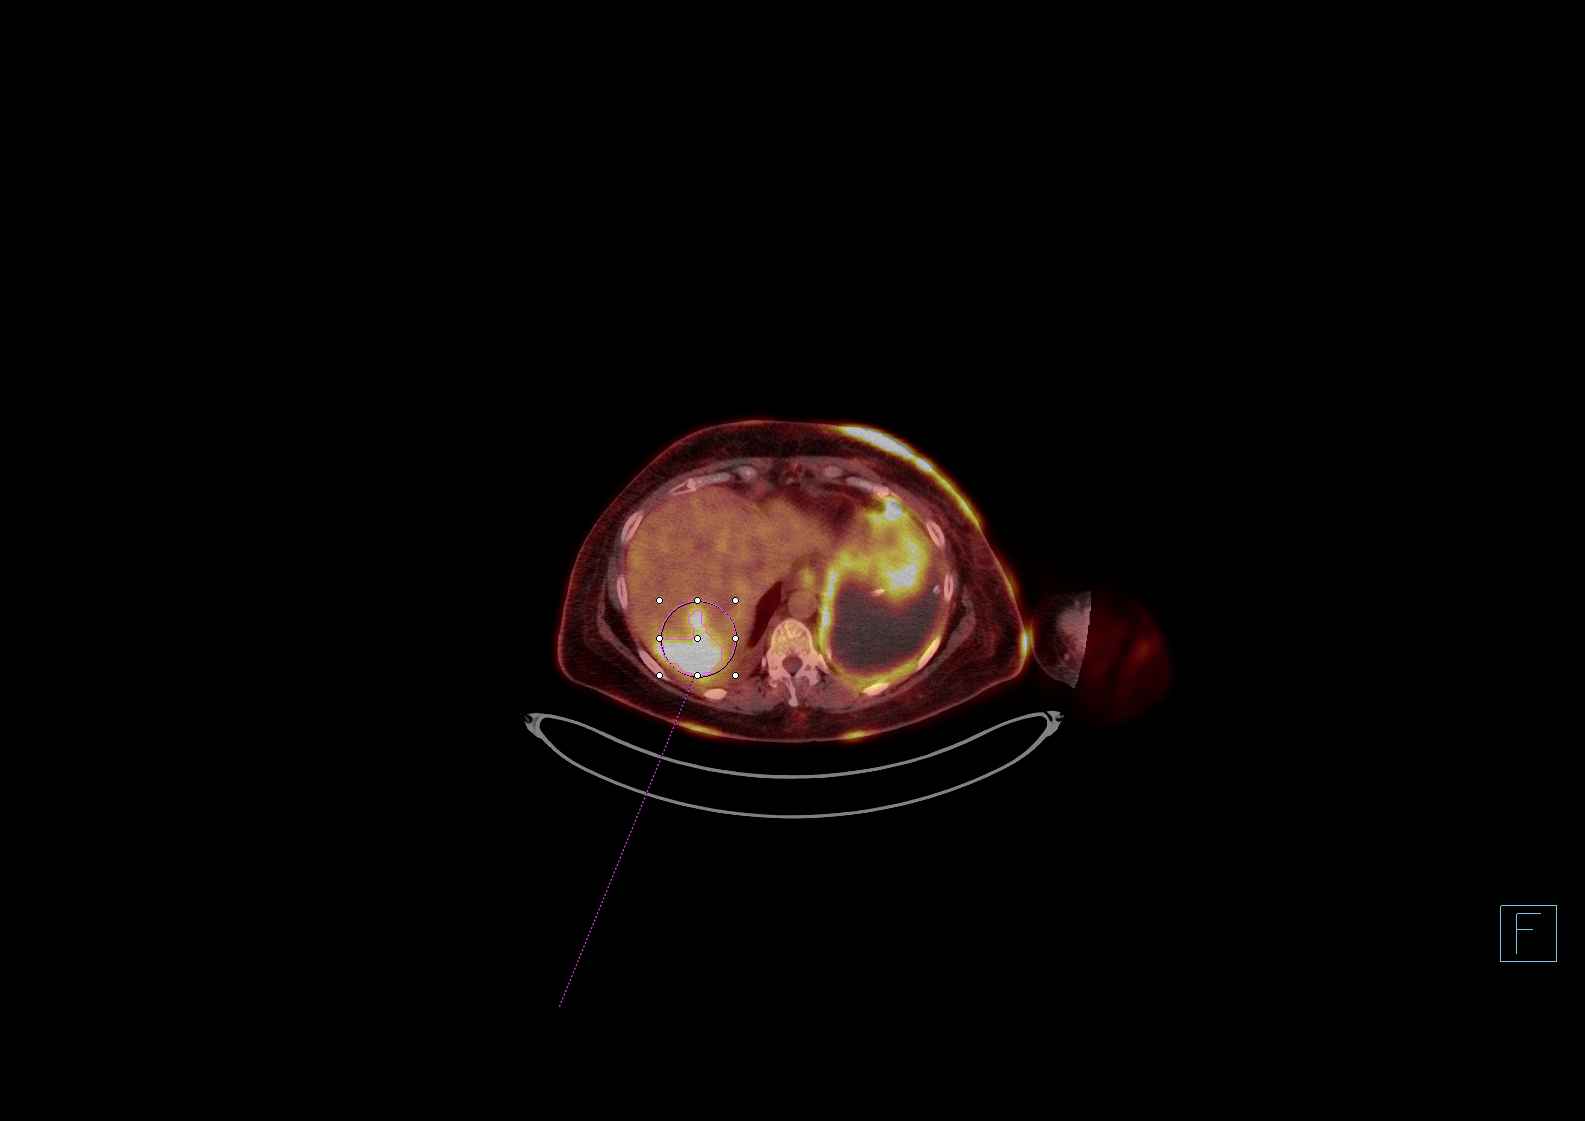

[19 of 25 positions shown; findings below may reference images not displayed]

FINDINGS: NECK

Interval development of bilateral supraclavicular adenopathy. Index
left supraclavicular lymph node measures 9 mm and has an SUV max
equal to 7.9.

CHEST

Hypermetabolic tumor within the left chest wall is again identified.
The SUV max within this area is equal to 7.9. This is compared with
4.5 previously. Persistent hypermetabolic tumor is identified within
left axillary lymph nodes. There are new hypermetabolic right
axillary lymph nodes which have an SUV max equal to 5..

Moderate left pleural effusion is new from the previous PET-CT.
There is diffuse malignant range FDG uptake involving the left lung
pleura. In the absence of interval pleurodesis findings are
concerning for trans pleural spread of tumor within the left hemi
thorax.

Interval development of extensive mediastinal and bilateral hilar
lymph node metastasis. Hypermetabolic right paratracheal lymph node
measures 1.4 cm and has an SUV max equal to 7.2.

ABDOMEN/PELVIS

New hypermetabolic liver metastasis. Index lesion involving the
posterior right hepatic lobe measures approximate 5.7 cm and has an
SUV max equal to 9.8.

Interval development of hypermetabolic adrenal metastasis. Index
lesion in the left adrenal gland measures 2.5 cm and has an SUV max
equal to 11.1. Hypermetabolic abdominal adenopathy is identified and
appears new from previous exam. Index lymph node in the
gastrohepatic ligament region has an SUV max equal to 4.7. New
hypermetabolic lymph node within the left inguinal region is
identified. This measures 1.5 cm short axis and has an SUV max equal
to 7.7. No abnormal hypermetabolic activity within the liver,
pancreas, adrenal glands, or spleen. No hypermetabolic lymph nodes
in the abdomen or pelvis.

SKELETON

New, multi focal hypermetabolic bone metastasis. Index lesion
involving the T8 vertebra has an SUV max equal to 12.1. Index lesion
involving the superior endplate of L3 has an SUV max equal to 10.6.
IMPRESSION: 1. Marked interval progression of disease.
2. New bilateral supraclavicular, right axillary, mediastinal, and
bilateral hilar metastatic adenopathy
3. Suspect trans pleural spread of tumor within the left hemi
thorax.
4. New liver and bilateral adrenal gland metastasis.
5. New upper abdominal and left inguinal lymph node metastasis
6. New multifocal bone metastasis.
